# Patient Record
Sex: Female | Born: 1990 | Race: White | Hispanic: No | Marital: Married | Smoking: Never smoker
Health system: Southern US, Community
[De-identification: ages and names within clinical notes are randomized; demographics above are authoritative.]

## PROBLEM LIST (undated history)

## (undated) DIAGNOSIS — Q231 Congenital insufficiency of aortic valve: Secondary | ICD-10-CM

## (undated) DIAGNOSIS — R519 Headache, unspecified: Secondary | ICD-10-CM

## (undated) DIAGNOSIS — R51 Headache: Secondary | ICD-10-CM

## (undated) DIAGNOSIS — Q2381 Bicuspid aortic valve: Secondary | ICD-10-CM

## (undated) HISTORY — DX: Bicuspid aortic valve: Q23.81

## (undated) HISTORY — PX: WISDOM TOOTH EXTRACTION: SHX21

## (undated) HISTORY — DX: Congenital insufficiency of aortic valve: Q23.1

---
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
## 2008-02-10 ENCOUNTER — Ambulatory Visit: Payer: Self-pay | Admitting: Internal Medicine

## 2008-04-18 ENCOUNTER — Encounter: Payer: Self-pay | Admitting: Internal Medicine

## 2008-04-18 ENCOUNTER — Ambulatory Visit: Payer: Self-pay

## 2009-12-11 ENCOUNTER — Encounter (INDEPENDENT_AMBULATORY_CARE_PROVIDER_SITE_OTHER): Payer: Self-pay | Admitting: *Deleted

## 2010-01-31 ENCOUNTER — Ambulatory Visit: Payer: Self-pay | Admitting: Internal Medicine

## 2010-01-31 DIAGNOSIS — I350 Nonrheumatic aortic (valve) stenosis: Secondary | ICD-10-CM

## 2010-12-17 NOTE — Letter (Signed)
Summary: Appointment - Reminder 2  [facility] [employer], Main Office  [address]   [place], [place] [address]   Phone: [phone]  Fax: [phone]     December 11, 2009 MRN: [id number]   Amanda [name] [address] [place], [place]  [address]   Dear [name],  Our records indicate that it is time to schedule a follow-up appointment with Dr. Tenny Craw. It is very important that we reach you to schedule this appointment. We look forward to participating in your health care needs. Please contact us at the number listed above at your earliest convenience to schedule your appointment.  If you are unable to make an appointment at this time, give [place] a call so we can update our records.   Sincerely,    Migdalia Dk [facility] Scheduling Team

## 2010-12-17 NOTE — Assessment & Plan Note (Signed)
Summary: per check out/sf   Visit Type:  Follow-up Primary Provider:  [facility]  CC:  no complaints.  History of Present Illness: Patient is a 20 year old with a history of a bicuspid aortic valve.  She was last in clinic in 2009.  She had an echo done soon after that showed mild AS with a mean gradient of 12 mm Hg.  there was mild AI. Since seen, she has done well.  She denies chest pain.  No shortness of  breath.  No palpitations.  Current Medications (verified): 1)  Yaz 3-0.02 Mg Tabs (Drospirenone-Ethinyl Estradiol) .... As Directed  Allergies (verified): No Known Drug Allergies  Past History:  Family History: Last updated: 01/31/2010  Negative for premature CAD.  Social History: Last updated: 01/31/2010 :  Does not smoke, does not drink.  Student  Past Medical History:  Bicuspid aortic valve   Family History:  Negative for premature CAD.  Social History: :  Does not smoke, does not drink.  Student  Review of Systems       All systems reviewed.  Negatvie to the above problem except as noted above.  Vital Signs:  Patient profile:   20 year old female Height:      61 inches Weight:      100 pounds BMI:     18.96 Pulse rate:   81 / minute BP sitting:   95 / 60  (left arm) Cuff size:   regular  Vitals Entered By: Burnett Kanaris, CNA (January 31, 2010 3:35 PM)  Physical Exam  Additional Exam:  patient is in NAD HEENT:  Normocephalic, atraumatic. EOMI, PERRLA.  Neck: JVP is normal. No thyromegaly. No bruits.  Lungs: clear to auscultation. No rales no wheezes.  Heart: Regular rate and rhythm. Normal S1, S2. No S3.   Gr i-II/VI systolic murmur. PMI not displaced.  Abdomen:  Supple, nontender. Normal bowel sounds. No masses. No hepatomegaly.  Extremities:   Good distal pulses throughout. No lower extremity edema.  Musculoskeletal :moving all extremities.  Neuro:   alert and oriented x3.    Impression & Recommendations:  Problem # 1:   AORTIC STENOSIS/ INSUFFICIENCY, NON-RHEUMATIC (ICD-424.1) Patient is doing well.  Exam again shows mild AS.  No diastolic murmurs noted. I would f/u in 2 years.  Continue activiities with no restriction.  Appended Document: per check out/sf EKG:  NSR.  81 bpm.

## 2011-04-01 NOTE — Assessment & Plan Note (Signed)
[facility]                            CARDIOLOGY OFFICE NOTE   Amanda, [name]                   MRN:          [id number]  DATE:02/10/2008                            DOB:          11/19/1990    IDENTIFICATION:  [name] is an 20 year old who was previously  followed by Dr. Lorna [name] for a bicuspid aortic valve.   The patient last had an echocardiogram back in November 2004 that showed  mild restriction of motion with a mean gradient of 9 mmHg across the  valve.  LV function was normal.  Plan was for recheck in 2 years.   Since seen the patient has done well.  She is a Printmaker at [employer].  She denies chest pain or palpitations.  No  shortness of breath.  No dizziness.   ALLERGIES:  None.   CURRENT MEDICATIONS:  Yaz.   PAST MEDICAL HISTORY:  As above.   SOCIAL HISTORY:  Does not smoke, does not drink.   REVIEW OF SYSTEMS:  Negative to the above except as noted.   FAMILY HISTORY:  Negative.   PHYSICAL EXAM:  The patient is in no distress.  Blood pressure is 92/64 pulse is 68, weight 100.  HEENT:  Normocephalic, atraumatic, EOMI, PERRL.  Mucous membranes moist.  NECK:  JVP is normal.  No thyromegaly.  No bruits.  Lungs are clear to auscultation.  Cardiac exam regular rate and rhythm, S1-S2, positive click.  No  murmurs.  ABDOMEN:  Supple, nontender.  No masses.  No hepatomegaly.  EXTREMITIES:  Good distal pulses, equal onset.  No edema.  SKIN:  Small tattoo in the waistline.   IMPRESSION:  Amanda [name] is an 20 year old with a bicuspid aortic valve on  examination today it does not sound like she has any significant  stenosis.  I do not hear a murmur of regurgitation.  I still think it  would be important to get an echocardiogram since she is now fully  grown.  Last one was done when she was 11.  I will set tentatively to  see her in 2 years otherwise, based on the AHA guidelines, no longer to  take  antibiotics prior to any dental work.   ADDENDUM:  12-lead EKG normal sinus rhythm 65 beats per minute and  nonspecific RV conduction delay.     Amanda Riffle, MD, [facility]  Electronically Signed    PVR/MedQ  DD: 02/10/2008  DT: 02/11/2008  Job #: [id number]   cc:   Dr. Esperanza [name]

## 2012-03-05 ENCOUNTER — Telehealth: Payer: Self-pay | Admitting: Internal Medicine

## 2012-03-05 NOTE — Telephone Encounter (Signed)
closed

## 2012-05-31 ENCOUNTER — Encounter: Payer: Self-pay | Admitting: Internal Medicine

## 2012-05-31 ENCOUNTER — Ambulatory Visit (INDEPENDENT_AMBULATORY_CARE_PROVIDER_SITE_OTHER): Payer: BC Managed Care – PPO | Admitting: Internal Medicine

## 2012-05-31 VITALS — BP 110/70 | HR 79 | Ht 61.0 in | Wt 106.8 lb

## 2012-05-31 DIAGNOSIS — I359 Nonrheumatic aortic valve disorder, unspecified: Secondary | ICD-10-CM

## 2012-05-31 NOTE — Patient Instructions (Signed)
Your physician wants you to follow-up in:  2 years. You will receive a reminder letter in the mail two months in advance. If you don't receive a letter, please call our office to schedule the follow-up appointment.   

## 2012-05-31 NOTE — Progress Notes (Signed)
Amanda [name] is a 21 year old with a history of a bicuspid aortic valve.  She was last in clinic in 2011.  She had an echo done soon after that showed mild AS and  mild AI. Since seen, she has done well.  She denies chest pain.  No shortness of  breath.  No palpitations.  She has questions today about birth control.  Questions IUD vs implant.  Followed by [name].  No Known Allergies  Current Outpatient Prescriptions  Medication Sig Dispense Refill  . CAMILA 0.35 MG tablet Take 1 tablet by mouth daily.       . naproxen sodium (ANAPROX) 550 MG tablet Take 550 mg by mouth as needed.       . SUMAtriptan (IMITREX) 50 MG tablet Take 50 mg by mouth as needed.         Past Medical History  Diagnosis Date  . Bicuspid aortic valve     Past Surgical History  Procedure Date  . Wisdom tooth extraction     Family History  Problem Relation Age of Onset  . Heart disease Maternal Grandmother     History   Social History  . Marital Status: Single    Spouse Name: N/A    Number of Children: N/A  . Years of Education: N/A   Occupational History  . Not on file.   Social History Main Topics  . Smoking status: Never Smoker   . Smokeless tobacco: Not on file  . Alcohol Use: Yes  . Drug Use: No  . Sexually Active:    Other Topics Concern  . Not on file   Social History Narrative   21 year old who was previously followed by Dr Lorna Few for bicuspid aortic valve. 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  They are negative to the above problem except as previously stated.  Vital Signs: BP 110/70  Pulse 79  Ht 5\' 1"  (1.549 m)  Wt 106 lb 12.8 oz (48.444 kg)  BMI 20.18 kg/m2  Physical Exam Patient is in NAD HEENT:  Normocephalic, atraumatic. EOMI, PERRLA.  Neck: JVP is normal. No thyromegaly. No bruits.  Lungs: clear to auscultation. No rales no wheezes.  Heart: Regular rate and rhythm. Normal  S1, S2. No S3.  Gr II/VI systolic murmur at base.  No diastolic murmurs. PMI not displaced.  Abdomen:  Supple, nontender. Normal bowel sounds. No masses. No hepatomegaly.  Extremities:   Good distal pulses throughout. No radial femoral delay. No lower extremity edema.  Musculoskeletal :moving all extremities.  Neuro:   alert and oriented x3.  CN II-XII grossly intact.  EKG:  SR.  79 bpm.   Assessment and Plan:  1.  Bicuspid AV.  Exam suggests that AS remains very mild.  I do not hear a diastolic murmur, suggesting AI remains mild. I will review echo from 2011.  I am not convinced she needs a repeat now.  Continue activites, unrestricted.  2.  Birth control.  I will need to review IUD use (infection rate) with OB and get back in touch with patient Should she get pregnant, I do not think she should have any problmes from a cardiac standpoint.  F/U otherwise in 2 years.

## 2013-04-15 ENCOUNTER — Telehealth: Payer: Self-pay | Admitting: Internal Medicine

## 2013-04-15 NOTE — Telephone Encounter (Signed)
Appt made to see Dr. Tenny Craw.

## 2013-04-15 NOTE — Telephone Encounter (Signed)
New Problem   Pt states she had some chest pain and SOB and EMS was called to assist with her, she said she is feeling fine now and is at home. This occurred while she was at work. She was advised by EMS to contact us. Pt states she is not sure if this was a panic attack or anxiety attack .

## 2013-04-18 ENCOUNTER — Ambulatory Visit (INDEPENDENT_AMBULATORY_CARE_PROVIDER_SITE_OTHER): Payer: BC Managed Care – PPO | Admitting: Internal Medicine

## 2013-04-18 VITALS — BP 108/62 | HR 76 | Ht 61.0 in | Wt 105.2 lb

## 2013-04-18 DIAGNOSIS — R450 Nervousness: Secondary | ICD-10-CM

## 2013-04-18 LAB — CBC WITH DIFFERENTIAL/PLATELET
Basophils Absolute: 0 10*3/uL (ref 0.0–0.1)
Eosinophils Absolute: 0.1 10*3/uL (ref 0.0–0.7)
HCT: 37.1 % (ref 36.0–46.0)
Hemoglobin: 12.5 g/dL (ref 12.0–15.0)
Lymphocytes Relative: 38.8 % (ref 12.0–46.0)
Lymphs Abs: 2.3 10*3/uL (ref 0.7–4.0)
MCHC: 33.8 g/dL (ref 30.0–36.0)
Monocytes Absolute: 0.4 10*3/uL (ref 0.1–1.0)
Neutro Abs: 3.1 10*3/uL (ref 1.4–7.7)
RDW: 13.9 % (ref 11.5–14.6)

## 2013-04-18 LAB — BASIC METABOLIC PANEL
Calcium: 9.4 mg/dL (ref 8.4–10.5)
Creatinine, Ser: 0.6 mg/dL (ref 0.4–1.2)

## 2013-04-18 LAB — TSH: TSH: 0.99 u[IU]/mL (ref 0.35–5.50)

## 2013-04-18 NOTE — Progress Notes (Signed)
HPI Patient is a 22 yo who has a history of bicuspid AV with mild AS/AI  By echo in 2009  I saw her in 2013.  She was previously seen by Larinda Buttery.   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 EMS called  O2 put on  SPell eased in a few min  She felt better  Has not had any problems since. No SOB Active Denies overexertion of upper chest  Getting married in 2 wks. No Known Allergies  Current Outpatient Prescriptions  Medication Sig Dispense Refill  . medroxyPROGESTERone (DEPO-PROVERA) 150 MG/ML injection Inject 150 mg into the muscle every 3 (three) months.      . naproxen sodium (ANAPROX) 550 MG tablet Take 550 mg by mouth as needed.       . SUMAtriptan (IMITREX) 50 MG tablet Take 50 mg by mouth as needed.        No current facility-administered medications for this visit.    Past Medical History  Diagnosis Date  . Bicuspid aortic valve     Past Surgical History  Procedure Laterality Date  . Wisdom tooth extraction      Family History  Problem Relation Age of Onset  . Heart disease Maternal Grandmother     History   Social History  . Marital Status: Single    Spouse Name: N/A    Number of Children: N/A  . Years of Education: N/A   Occupational History  . Not on file.   Social History Main Topics  . Smoking status: Never Smoker   . Smokeless tobacco: Not on file  . Alcohol Use: Yes  . Drug Use: No  . Sexually Active:    Other Topics Concern  . Not on file   Social History Narrative   22 year old who was previously followed by Dr Lorna Few for bicuspid aortic valve. 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  They are negative to the above problem except as previously stated.  Vital Signs: BP 108/62   Pulse 76  Ht 5\' 1"  (1.549 m)  Wt 105 lb 4 oz (47.741 kg)  BMI 19.9 kg/m2  Physical Exam Patient is in NAD HEENT:  Normocephalic, atraumatic. EOMI, PERRLA.  Neck: JVP is normal.  No bruits.  Lungs: clear to auscultation. No rales no wheezes.  Heart: Regular rate and rhythm. Normal S1, S2. No S3.   Gr I/VI systolic murmur LSB No diastolic murmurs. PMI not displaced. Chest  Nontender.  Abdomen:  Supple, nontender. Normal bowel sounds. No masses. No hepatomegaly.  Extremities:   Good distal pulses throughout. No lower extremity edema.  Musculoskeletal :moving all extremities.  Neuro:   alert and oriented x3.  CN II-XII grossly intact.   Assessment and Plan:  1.  CP  Atypical  Sounds muscular  I do not think cardiac WIth jitterienss will check TSH, CBC, BMET  2.  Bicuspid AV  Will get echo this summer  Sounds unchanged but last echo was 200

## 2013-04-18 NOTE — Patient Instructions (Addendum)
LABS TODAY:  CBC, BMET, TSH  Call us after your honeymoon to schedule ECHO. [phone] Kandee Keen

## 2014-06-21 ENCOUNTER — Encounter: Payer: Self-pay | Admitting: Internal Medicine

## 2014-07-07 ENCOUNTER — Ambulatory Visit: Payer: BC Managed Care – PPO | Admitting: Internal Medicine

## 2014-08-06 NOTE — Progress Notes (Signed)
HPI Patient is a 23 yo who has a history of bicuspid AV with mild AS/AI  By echo in 2009   Patinet was las t in clinic in 2014 SInce seen she has done well  No dizziness  No SOB  No CP  Active She does have migraines,  Had since age 67  Aura with HA NOw on inderal with rizatriptan prn  Denies dizziness   No Known Allergies  Current Outpatient Prescriptions  Medication Sig Dispense Refill  . naproxen sodium (ANAPROX) 550 MG tablet Take 550 mg by mouth as needed.       . NON FORMULARY NEXPLANAN  - birth control implant in left arm      . ondansetron (ZOFRAN) 8 MG tablet Take by mouth every 8 (eight) hours as needed for nausea or vomiting.      . propranolol (INDERAL) 80 MG tablet 1 tab by mouth daily      . rizatriptan (MAXALT-MLT) 10 MG disintegrating tablet Take 10 mg by mouth as needed for migraine. May repeat in 2 hours if needed       No current facility-administered medications for this visit.    Past Medical History  Diagnosis Date  . Bicuspid aortic valve     Past Surgical History  Procedure Laterality Date  . Wisdom tooth extraction      Family History  Problem Relation Age of Onset  . Heart disease Maternal Grandmother     History   Social History  . Marital Status: Single    Spouse Name: N/A    Number of Children: N/A  . Years of Education: N/A   Occupational History  . Not on file.   Social History Main Topics  . Smoking status: Never Smoker   . Smokeless tobacco: Not on file  . Alcohol Use: Yes  . Drug Use: No  . Sexual Activity:    Other Topics Concern  . Not on file   Social History Narrative   23 year old who was previously followed by Dr Lorna Few for bicuspid aortic valve. 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  They are negative to the above problem except as previously stated.  Vital Signs: BP 99/66  Pulse 61  Ht  (1.575 m)  Wt 114 lb (51.71 kg)   BMI 20.85 kg/m2  Physical Exam Patient is in NAD HEENT:  Normocephalic, atraumatic. EOMI, PERRLA.  Neck: JVP is normal.  No bruits.  Lungs: clear to auscultation. No rales no wheezes.  Heart: Regular rate and rhythm. Normal S1, S2. No S3.   Gr I/VI systolic murmur LSB No diastolic murmurs. PMI not displaced. Chest  Nontender.  Abdomen:  Supple, nontender. Normal bowel sounds. No masses. No hepatomegaly.  Extremities:   Good distal pulses throughout. No lower extremity edema.  Musculoskeletal :moving all extremities.  Neuro:   alert and oriented x3.  CN II-XII grossly intact.  EKG  SR   60    Assessment and Plan:  1. Bicuspid AV  Exam is unchanged  Will get echo as baseline  Has not had one in 6 years  Not in system  2.  Migraines  OK to use regimen as noted  Stay active  F/u in 18 mon.

## 2014-08-07 ENCOUNTER — Ambulatory Visit (INDEPENDENT_AMBULATORY_CARE_PROVIDER_SITE_OTHER): Payer: BC Managed Care – PPO | Admitting: Internal Medicine

## 2014-08-07 ENCOUNTER — Encounter: Payer: Self-pay | Admitting: Internal Medicine

## 2014-08-07 VITALS — BP 99/66 | HR 61 | Ht 62.0 in | Wt 114.0 lb

## 2014-08-07 DIAGNOSIS — I359 Nonrheumatic aortic valve disorder, unspecified: Secondary | ICD-10-CM

## 2014-08-07 NOTE — Patient Instructions (Signed)
Your physician recommends that you continue on your current medications as directed. Please refer to the Current Medication list given to you today. Your physician has requested that you have an echocardiogram. Echocardiography is a painless test that uses sound waves to create images of your heart. It provides your doctor with information about the size and shape of your heart and how well your heart's chambers and valves are working. This procedure takes approximately one hour. There are no restrictions for this procedure.  Your physician wants you to follow-up in: 18 months with Dr. Tenny Craw.  You will receive a reminder letter in the mail two months in advance. If you don't receive a letter, please call our office to schedule the follow-up appointment.

## 2014-09-11 ENCOUNTER — Ambulatory Visit (HOSPITAL_COMMUNITY): Payer: BC Managed Care – PPO | Attending: Cardiology | Admitting: Radiology

## 2014-09-11 DIAGNOSIS — I359 Nonrheumatic aortic valve disorder, unspecified: Secondary | ICD-10-CM | POA: Diagnosis present

## 2014-09-11 NOTE — Progress Notes (Signed)
Echocardiogram performed.  

## 2016-05-01 ENCOUNTER — Encounter (HOSPITAL_COMMUNITY): Payer: Self-pay | Admitting: Emergency Medicine

## 2016-05-01 DIAGNOSIS — R1030 Lower abdominal pain, unspecified: Secondary | ICD-10-CM | POA: Insufficient documentation

## 2016-05-01 DIAGNOSIS — R102 Pelvic and perineal pain: Secondary | ICD-10-CM | POA: Diagnosis not present

## 2016-05-01 LAB — URINALYSIS, ROUTINE W REFLEX MICROSCOPIC
BILIRUBIN URINE: NEGATIVE
Glucose, UA: NEGATIVE mg/dL
HGB URINE DIPSTICK: NEGATIVE
KETONES UR: NEGATIVE mg/dL
Leukocytes, UA: NEGATIVE
Nitrite: NEGATIVE
Protein, ur: NEGATIVE mg/dL
SPECIFIC GRAVITY, URINE: 1.018 (ref 1.005–1.030)
pH: 7 (ref 5.0–8.0)

## 2016-05-01 LAB — COMPREHENSIVE METABOLIC PANEL
ALBUMIN: 4.1 g/dL (ref 3.5–5.0)
ALT: 65 U/L — AB (ref 14–54)
AST: 44 U/L — AB (ref 15–41)
Alkaline Phosphatase: 53 U/L (ref 38–126)
Anion gap: 7 (ref 5–15)
BUN: 11 mg/dL (ref 6–20)
CHLORIDE: 105 mmol/L (ref 101–111)
CO2: 24 mmol/L (ref 22–32)
CREATININE: 0.64 mg/dL (ref 0.44–1.00)
Calcium: 9.6 mg/dL (ref 8.9–10.3)
GFR calc Af Amer: >60 mL/min (ref >60)
GFR calc non Af Amer: >60 mL/min (ref >60)
GLUCOSE: 95 mg/dL (ref 65–99)
Potassium: 4.1 mmol/L (ref 3.5–5.1)
SODIUM: 136 mmol/L (ref 135–145)
Total Bilirubin: 0.2 mg/dL — ABNORMAL LOW (ref 0.3–1.2)
Total Protein: 7.1 g/dL (ref 6.5–8.1)

## 2016-05-01 LAB — CBC WITH DIFFERENTIAL/PLATELET
BASOS ABS: 0 10*3/uL (ref 0.0–0.1)
Basophils Relative: 0 %
EOS ABS: 0.1 10*3/uL (ref 0.0–0.7)
EOS PCT: 2 %
HCT: 39.8 % (ref 36.0–46.0)
Hemoglobin: 12.9 g/dL (ref 12.0–15.0)
Lymphocytes Relative: 47 %
Lymphs Abs: 2.6 10*3/uL (ref 0.7–4.0)
MCH: 24.8 pg — ABNORMAL LOW (ref 26.0–34.0)
MCHC: 32.4 g/dL (ref 30.0–36.0)
MCV: 76.4 fL — ABNORMAL LOW (ref 78.0–100.0)
Monocytes Absolute: 0.4 10*3/uL (ref 0.1–1.0)
Monocytes Relative: 7 %
NEUTROS PCT: 44 %
Neutro Abs: 2.4 10*3/uL (ref 1.7–7.7)
PLATELETS: 250 10*3/uL (ref 150–400)
RBC: 5.21 MIL/uL — AB (ref 3.87–5.11)
RDW: 12.7 % (ref 11.5–15.5)
WBC: 5.5 10*3/uL (ref 4.0–10.5)

## 2016-05-01 NOTE — ED Notes (Addendum)
Pt c/o lower abd pain,  Onset approx 1 hour ago,  Pt denies any nausea, vomiting or other symptoms.  Pt st's pain started while having intercourse

## 2016-05-02 ENCOUNTER — Emergency Department (HOSPITAL_COMMUNITY): Payer: BC Managed Care – PPO

## 2016-05-02 ENCOUNTER — Emergency Department (HOSPITAL_COMMUNITY)
Admission: EM | Admit: 2016-05-02 | Discharge: 2016-05-02 | Disposition: A | Discharge status: Home / Self Care | Payer: BC Managed Care – PPO | Attending: Emergency Medicine | Admitting: Emergency Medicine

## 2016-05-02 DIAGNOSIS — R109 Unspecified abdominal pain: Secondary | ICD-10-CM

## 2016-05-02 DIAGNOSIS — R102 Pelvic and perineal pain: Secondary | ICD-10-CM

## 2016-05-02 LAB — WET PREP, GENITAL
CLUE CELLS WET PREP: NONE SEEN
SPERM: NONE SEEN
Trich, Wet Prep: NONE SEEN
Yeast Wet Prep HPF POC: NONE SEEN

## 2016-05-02 LAB — GC/CHLAMYDIA PROBE AMP (~~LOC~~) NOT AT ARMC
Chlamydia: NEGATIVE
NEISSERIA GONORRHEA: NEGATIVE

## 2016-05-02 LAB — I-STAT BETA HCG BLOOD, ED (MC, WL, AP ONLY)

## 2016-05-02 NOTE — ED Provider Notes (Signed)
CSN: [id number]     Arrival date & time 05/01/16  1907 History  By signing my name below, I, Amanda PitmanLeila [name], attest that this documentation has been prepared under the direction and in the presence of Geoffery Lyonsouglas Tora Prunty, MD.  Electronically signed: Ginette PitmanLeila [name], ED Scribe. . 1:36 AM.   Chief Complaint  Patient presents with  . Abdominal Pain   The history is provided by the patient. No language interpreter was used.   HPI Comments: Amanda [name] is a 25 y.o. female who presents to the Emergency Department complaining of sudden onset, constant, sharp lower abdominal pain which started at approximately 6:00 pm tonight. Pt states symptoms started while having intercourse. She notes she has recently had her birth control implanon removed in April of this year.  Pt states she is currently on the birth control pills and reports one normal menstrual period since sarting. She denies fever, irregular periods, vaginal bleeding or  vaginal discharge.  Past Medical History  Diagnosis Date  . Bicuspid aortic valve    Past Surgical History  Procedure Laterality Date  . Wisdom tooth extraction     Family History  Problem Relation Age of Onset  . Heart disease Maternal Grandmother    Social History  Substance Use Topics  . Smoking status: Never Smoker   . Smokeless tobacco: None  . Alcohol Use: Yes   OB History    No data available     Review of Systems  Constitutional: Negative for fever.  Gastrointestinal: Positive for abdominal pain.  Genitourinary: Negative for vaginal bleeding, vaginal discharge and menstrual problem.  All other systems reviewed and are negative.  Allergies  Review of patient's allergies indicates no known allergies.  Home Medications   Prior to Admission medications   Medication Sig Start Date End Date Taking? Authorizing Provider  naproxen sodium (ANAPROX) 550 MG tablet Take 550 mg by mouth as needed.  05/26/12   Historical Provider, MD  NON FORMULARY NEXPLANAN  -  birth control implant in left arm    Historical Provider, MD  ondansetron (ZOFRAN) 8 MG tablet Take by mouth every 8 (eight) hours as needed for nausea or vomiting.    Historical Provider, MD  propranolol (INDERAL) 80 MG tablet 1 tab by mouth daily    Historical Provider, MD  rizatriptan (MAXALT-MLT) 10 MG disintegrating tablet Take 10 mg by mouth as needed for migraine. May repeat in 2 hours if needed    Historical Provider, MD   BP 117/68 mmHg  Pulse 70  Temp(Src) 99.4 F (37.4 C) (Oral)  Resp 18  SpO2 100%  LMP 04/14/2016 Physical Exam  Constitutional: She is oriented to person, place, and time. She appears well-developed and well-nourished. No distress.  HENT:  Head: Normocephalic and atraumatic.  Neck: Normal range of motion.  Cardiovascular: Normal rate, regular rhythm and normal heart sounds.   Pulmonary/Chest: Effort normal and breath sounds normal. She has no wheezes.  Abdominal: Soft. Bowel sounds are normal. She exhibits no distension. There is tenderness. There is no rebound and no guarding.  TTP in the suprapubic region.  Genitourinary:  The pelvic examination is essentially unremarkable. There is no evidence for trauma. The cervix appears normal and there are no vaginal abnormalities. There is minimal discharge.  There are no adnexal masses and no cervical motion tenderness.  Musculoskeletal: Normal range of motion.  Neurological: She is alert and oriented to person, place, and time.  Skin: Skin is warm and dry. She is not diaphoretic.  Psychiatric: She has a normal mood and affect. Judgment normal.  Nursing note and vitals reviewed.   ED Course  Procedures  DIAGNOSTIC STUDIES: Oxygen Saturation is 100% on RA, normal by my interpretation.  COORDINATION OF CARE: 12:58 AM Discussed treatment plan with, lab work,  pelvic exam and US pelvis with pt at bedside and pt agreed to plan.  Labs Review Labs Reviewed  CBC WITH DIFFERENTIAL/PLATELET - Abnormal; Notable for  the following:    RBC 5.21 (*)    MCV 76.4 (*)    MCH 24.8 (*)    All other components within normal limits  COMPREHENSIVE METABOLIC PANEL - Abnormal; Notable for the following:    AST 44 (*)    ALT 65 (*)    Total Bilirubin 0.2 (*)    All other components within normal limits  URINALYSIS, ROUTINE W REFLEX MICROSCOPIC (NOT AT [facility])  POC URINE PREG, ED    Imaging Review No results found. I have personally reviewed and evaluated these images and lab results as part of my medical decision-making.    MDM   Final diagnoses:  None    Patient presents with complaints of pelvic pain that started during intercourse. Her laboratory studies are unremarkable, pregnancy test is negative, and physical examination is basically unremarkable. Her ultrasound does show free fluid which could be consistent with a recently ruptured cyst. She will be treated with ibuprofen and when necessary return.  I personally performed the services described in this documentation, which was scribed in my presence. The recorded information has been reviewed and is accurate.      Geoffery Lyons, MD 05/02/16 [phone]

## 2016-05-02 NOTE — ED Notes (Signed)
Pelvic cart at bedside. 

## 2016-05-02 NOTE — ED Notes (Signed)
Pt taken to US

## 2016-05-02 NOTE — Discharge Instructions (Signed)
Ibuprofen 600 mg every 6 hours as needed for pain.  We will call you if your cultures indicate you require further treatment.  Return to the emergency department if symptoms significantly worsen or change.   Abdominal Pain, Adult Many things can cause abdominal pain. Usually, abdominal pain is not caused by a disease and will improve without treatment. It can often be observed and treated at home. Your health care provider will do a physical exam and possibly order blood tests and X-rays to help determine the seriousness of your pain. However, in many cases, more time must pass before a clear cause of the pain can be found. Before that point, your health care provider may not know if you need more testing or further treatment. HOME CARE INSTRUCTIONS Monitor your abdominal pain for any changes. The following actions may help to alleviate any discomfort you are experiencing:  Only take over-the-counter or prescription medicines as directed by your health care provider.  Do not take laxatives unless directed to do so by your health care provider.  Try a clear liquid diet (broth, tea, or water) as directed by your health care provider. Slowly move to a bland diet as tolerated. SEEK MEDICAL CARE IF:  You have unexplained abdominal pain.  You have abdominal pain associated with nausea or diarrhea.  You have pain when you urinate or have a bowel movement.  You experience abdominal pain that wakes you in the night.  You have abdominal pain that is worsened or improved by eating food.  You have abdominal pain that is worsened with eating fatty foods.  You have a fever. SEEK IMMEDIATE MEDICAL CARE IF:  Your pain does not go away within 2 hours.  You keep throwing up (vomiting).  Your pain is felt only in portions of the abdomen, such as the right side or the left lower portion of the abdomen.  You pass bloody or black tarry stools. MAKE SURE YOU:  Understand these  instructions.  Will watch your condition.  Will get help right away if you are not doing well or get worse.   This information is not intended to replace advice given to you by your health care provider. Make sure you discuss any questions you have with your health care provider.   Document Released: 08/13/2005 Document Revised: 07/25/2015 Document Reviewed: 07/13/2013 Elsevier Interactive Patient Education [employer]2016 [employer]

## 2017-05-14 ENCOUNTER — Encounter: Payer: Self-pay | Admitting: Physician Assistant

## 2017-05-19 ENCOUNTER — Ambulatory Visit: Payer: BC Managed Care – PPO | Admitting: Physician Assistant

## 2017-06-03 ENCOUNTER — Encounter: Payer: Self-pay | Admitting: Physician Assistant

## 2017-06-03 ENCOUNTER — Ambulatory Visit (INDEPENDENT_AMBULATORY_CARE_PROVIDER_SITE_OTHER): Payer: BC Managed Care – PPO | Admitting: Physician Assistant

## 2017-06-03 VITALS — BP 106/70 | HR 81 | Ht 61.0 in | Wt 114.4 lb

## 2017-06-03 DIAGNOSIS — Z3A09 9 weeks gestation of pregnancy: Secondary | ICD-10-CM | POA: Diagnosis not present

## 2017-06-03 DIAGNOSIS — I350 Nonrheumatic aortic (valve) stenosis: Secondary | ICD-10-CM | POA: Diagnosis not present

## 2017-06-03 NOTE — Progress Notes (Addendum)
Cardiology Office Note    Date:  06/03/2017   ID:  Amanda [name], DOB [birth year], MRN [id number]  PCP:  Kaleen MaskElkins, Wilson Oliver, MD  Cardiologist:   Dr. Tenny Crawoss  Chief Complaint: 3 year follow up  History of Present Illness:   Amanda [name] is a 26 y.o. female with hx of bicuspid aortic valve & migraines presents for follow up.   Last echo 08/2014 showed normal LVEF, bicuspid aortic valve with mild AS & AR. Last seen by Dr. Tenny Crawoss 07/2014.  Here today for follow up. Her inderal has discontinued as she is pregnant ( 9 weeks). Going through morning sickness. Might feels dizziness when going through morning sickness. Otherwise no exertional chest pain, dyspnea or dizziness. Denies Radiation, orthopnea, PND, syncope, lower extremity edema or blood in her stool or urine.   Past Medical History:  Diagnosis Date  . Bicuspid aortic valve     Past Surgical History:  Procedure Laterality Date  . WISDOM TOOTH EXTRACTION      Current Medications: Prior to Admission medications   Medication Sig Start Date End Date Taking? Authorizing Provider  Prenatal Vit-Fe Fumarate-FA (PRENATAL VITAMIN PO) Take 1 tablet by mouth daily.   Yes [provider]     Allergies:   Zofran Frazier Richards[ondansetron hcl]   Social History   Social History  . Marital status: Single    Spouse name: N/A  . Number of children: N/A  . Years of education: N/A   Social History Main Topics  . Smoking status: Never Smoker  . Smokeless tobacco: Never Used  . Alcohol use Yes  . Drug use: No  . Sexual activity: Not Asked   Other Topics Concern  . None   Social History Narrative   26 year old who was previously followed by Dr Lorna FewStewart Schall for bicuspid aortic valve. 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 She does not smoke or drink     Family History:  The patient's family history includes Heart disease in her maternal grandmother.   ROS:   Please see the history of present illness.    ROS All  other systems reviewed and are negative.   PHYSICAL EXAM:   VS:  BP 106/70   Pulse 81   Ht 5\' 1"  (1.549 m)   Wt 114 lb 6.4 oz (51.9 kg)   BMI 21.62 kg/m    GEN: Well nourished, well developed, in no acute distress  HEENT: normal  Neck: no JVD, carotid bruits, or masses Cardiac: RRR; no murmurs, rubs, or gallops,no edema  Respiratory:  clear to auscultation bilaterally, normal work of breathing GI: soft, nontender, nondistended, + BS MS: no deformity or atrophy  Skin: warm and dry, no rash Neuro:  Alert and Oriented x 3, Strength and sensation are intact Psych: euthymic mood, full affect  Wt Readings from Last 3 Encounters:  06/03/17 114 lb 6.4 oz (51.9 kg)  08/07/14 114 lb (51.7 kg)  04/18/13 105 lb 4 oz (47.7 kg)      Studies/Labs Reviewed:   EKG:  EKG is ordered today.  EKG shows sinus rhythm at rate of 81 bpm and PACs.  Recent Labs: No results found for requested labs within last 8760 hours.   Lipid Panel No results found for: CHOL, TRIG, HDL, CHOLHDL, VLDL, LDLCALC, LDLDIRECT  Additional studies/ records that were reviewed today include:   Echocardiogram: 08/2014 Study Conclusions  - Left ventricle: The cavity size was normal. Wall thickness was normal. Systolic function was  normal. The estimated ejection fraction was in the range of 60% to 65%. - Aortic valve: Bicuspid. There was very mild stenosis. There was mild regurgitation. - Atrial septum: No defect or patent foramen ovale was identified.    ASSESSMENT & PLAN:    1. Bicuspid aortic valve - Last echocardiogram in 2015 showed mild AS & AR. No significant murmur appreciated on exam. No syncope, dyspnea, chest pain or dizziness other than morning sickness.  2. Pregnancy - Advised to drink plenty of water.     Medication Adjustments/Labs and Tests Ordered: Current medicines are reviewed at length with the patient today.  Concerns regarding medicines are outlined above.  Medication changes,  Labs and Tests ordered today are listed in the Patient Instructions below. Patient Instructions  Medication Instructions:   Your physician recommends that you continue on your current medications as directed. Please refer to the Current Medication list given to you today.   If you need a refill on your cardiac medications before your next appointment, please call your pharmacy.  Labwork: NONE ORDERED  TODAY    Testing/Procedures: NONE ORDERED  TODAY    Follow-Up: Your physician wants you to follow-up in: ONE YEAR WITH ROSS  You will receive a reminder letter in the mail two months in advance. If you don't receive a letter, please call our office to schedule the follow-up appointment.     Any Other Special Instructions Will Be Listed Below (If Applicable).                                                                                                                                                      Lorelei Pont, [place]  06/03/2017 2:53 PM    [facility] [address] [place], [place], [place]  [address] Phone: [phone]; Fax: [phone]

## 2017-06-03 NOTE — Patient Instructions (Signed)
Medication Instructions:   Your physician recommends that you continue on your current medications as directed. Please refer to the Current Medication list given to you today.   If you need a refill on your cardiac medications before your next appointment, please call your pharmacy.  Labwork:  NONE ORDERED  TODAY    Testing/Procedures: NONE ORDERED  TODAY    Follow-Up:  Your physician wants you to follow-up in: ONE YEAR WITH ROSS  You will receive a reminder letter in the mail two months in advance. If you don't receive a letter, please call our office to schedule the follow-up appointment.       Any Other Special Instructions Will Be Listed Below (If Applicable).                                                                                                                                                   

## 2017-06-05 ENCOUNTER — Telehealth: Payer: Self-pay | Admitting: *Deleted

## 2017-06-05 DIAGNOSIS — I350 Nonrheumatic aortic (valve) stenosis: Secondary | ICD-10-CM

## 2017-06-05 NOTE — Progress Notes (Signed)
I would repeat echo to confirm gradients

## 2017-06-05 NOTE — Progress Notes (Signed)
Please get complete echo to evaluate gradient for bicuspid valve as recommended by Dr.  Tenny Crawoss.

## 2017-06-05 NOTE — Telephone Encounter (Signed)
Left a message for pt to call back re Dr. Tenny Crawoss wants to order another echo.

## 2017-06-05 NOTE — Telephone Encounter (Signed)
-----   Message from EvaBhavinkumar Bhagat, [place] sent at 06/05/2017  3:34 PM EDT -----   ----- Message ----- From: Pricilla Riffleoss, Paula V, MD Sent: 06/05/2017   2:32 PM To: Manson PasseyBhavinkumar Bhagat, PA    ----- Message ----- From: Manson PasseyBhagat, Bhavinkumar, PA Sent: 06/03/2017   2:59 PM To: Pricilla RifflePaula Ross V, MD  Please advised regarding surveillance echocardiogram. She is 9 pregnant and is essentially asymptomatic except morning. No significant murmur.

## 2017-06-08 NOTE — Telephone Encounter (Signed)
Progress Notes Encounter Date: 06/03/2017 Pricilla Riffleoss, Paula V, MD  Cardiology     I would repeat echo to confirm gradients      Per Dr. Tenny Crawoss, ECHO ordered for scheduling. Patient agrees with treatment plan.

## 2017-06-08 NOTE — Telephone Encounter (Signed)
F/u message ° °Pt returning RN call. Please call back to discuss  °

## 2017-06-10 NOTE — Telephone Encounter (Signed)
Echo scheduled 06/12/17.

## 2017-06-11 LAB — OB RESULTS CONSOLE RUBELLA ANTIBODY, IGM: RUBELLA: IMMUNE

## 2017-06-11 LAB — OB RESULTS CONSOLE GC/CHLAMYDIA
CHLAMYDIA, DNA PROBE: NEGATIVE
GC PROBE AMP, GENITAL: NEGATIVE

## 2017-06-11 LAB — OB RESULTS CONSOLE HIV ANTIBODY (ROUTINE TESTING): HIV: NONREACTIVE

## 2017-06-11 LAB — OB RESULTS CONSOLE HEPATITIS B SURFACE ANTIGEN: Hepatitis B Surface Ag: NEGATIVE

## 2017-06-12 ENCOUNTER — Ambulatory Visit (HOSPITAL_COMMUNITY): Payer: BC Managed Care – PPO | Attending: Cardiology

## 2017-06-12 ENCOUNTER — Other Ambulatory Visit: Payer: Self-pay

## 2017-06-12 DIAGNOSIS — Q231 Congenital insufficiency of aortic valve: Secondary | ICD-10-CM | POA: Diagnosis not present

## 2017-06-12 DIAGNOSIS — O99411 Diseases of the circulatory system complicating pregnancy, first trimester: Secondary | ICD-10-CM | POA: Insufficient documentation

## 2017-06-12 DIAGNOSIS — I350 Nonrheumatic aortic (valve) stenosis: Secondary | ICD-10-CM

## 2017-06-12 DIAGNOSIS — Z3A10 10 weeks gestation of pregnancy: Secondary | ICD-10-CM | POA: Diagnosis not present

## 2017-07-28 ENCOUNTER — Telehealth: Payer: Self-pay | Admitting: Internal Medicine

## 2017-07-28 NOTE — Telephone Encounter (Signed)
New message       Calling to let Dr Tenny Crawoss know she is [redacted] weeks pregnant.  Will Dr Tenny Crawoss want to see her?  Is she considered "high risk" ?  Ok to call tomorrow

## 2017-07-28 NOTE — Telephone Encounter (Signed)
No she is not considered high risk with mean graident across AV of 11 mm Hg I can see her closer to 30 wks

## 2017-07-28 NOTE — Telephone Encounter (Signed)
Notes recorded by Pricilla Riffleoss, Paula V, MD on 06/14/2017 at 11:56 PM EDT Pumping function of heart is normal AV again noted to be bicuspic with very mild narrowing  No significant change from echo form 2015 Should not pose a problem with pregnancy    Echo was 06/14/17.  Will route to Dr. Tenny Crawoss to inform and then will call patient back if any recommendations.

## 2017-07-30 NOTE — Telephone Encounter (Signed)
Called patient about Dr. Tenny Crawoss' message. Patient will be at 30 weeks on the second week of December. Made an appointment with Dr. Tenny Crawoss on 10/26/17. Informed patient per Dr. Tenny Crawoss, that she is not considered hight risk with mean gradient across AV of 11 mg Hg. Patient verbalized understanding.

## 2017-07-30 NOTE — Telephone Encounter (Signed)
Follow up  ° ° ° °Pt is calling to follow up on this. Please call.  °

## 2017-08-11 ENCOUNTER — Ambulatory Visit (HOSPITAL_COMMUNITY): Payer: BC Managed Care – PPO

## 2017-08-11 ENCOUNTER — Encounter (HOSPITAL_COMMUNITY): Payer: BC Managed Care – PPO

## 2017-08-11 ENCOUNTER — Encounter (HOSPITAL_COMMUNITY): Payer: Self-pay

## 2017-08-14 ENCOUNTER — Encounter (HOSPITAL_COMMUNITY): Payer: Self-pay

## 2017-08-14 ENCOUNTER — Encounter (HOSPITAL_COMMUNITY): Payer: BC Managed Care – PPO

## 2017-08-14 ENCOUNTER — Other Ambulatory Visit (HOSPITAL_COMMUNITY): Payer: BC Managed Care – PPO

## 2017-10-21 LAB — OB RESULTS CONSOLE RPR: RPR: NONREACTIVE

## 2017-10-26 ENCOUNTER — Ambulatory Visit: Payer: BC Managed Care – PPO | Admitting: Internal Medicine

## 2017-10-28 ENCOUNTER — Telehealth: Payer: Self-pay | Admitting: Internal Medicine

## 2017-10-28 NOTE — Telephone Encounter (Signed)
F/U call:  Patient calling back, states that she is [redacted] weeks pregnant and that she was told to f/u with Dr. Tenny Crawoss for a 30 week check-up.

## 2017-10-28 NOTE — Telephone Encounter (Signed)
[redacted] weeks gestation--Having some SOB and palpitations.  Seeing obstetrics every other week. Had fetal echo to be on safe side. Was instructed to f/u at around 30 weeks.

## 2017-10-29 ENCOUNTER — Encounter: Payer: Self-pay | Admitting: Internal Medicine

## 2017-10-29 ENCOUNTER — Ambulatory Visit: Payer: BC Managed Care – PPO | Admitting: Internal Medicine

## 2017-10-29 VITALS — BP 102/58 | HR 80 | Ht 61.0 in | Wt 126.0 lb

## 2017-10-29 DIAGNOSIS — R002 Palpitations: Secondary | ICD-10-CM

## 2017-10-29 DIAGNOSIS — Q2381 Bicuspid aortic valve: Secondary | ICD-10-CM

## 2017-10-29 DIAGNOSIS — Q231 Congenital insufficiency of aortic valve: Secondary | ICD-10-CM

## 2017-10-29 NOTE — Patient Instructions (Addendum)
Your physician recommends that you continue on your current medications as directed. Please refer to the Current Medication list given to you today. Your physician wants you to follow-up in: 9 months with Dr. Ross.  You will receive a reminder letter in the mail two months in advance. If you don't receive a letter, please call our office to schedule the follow-up appointment.  

## 2017-10-29 NOTE — Progress Notes (Signed)
   Cardiology Office Note   Date:  10/29/2017   ID:  Amanda [name], DOB [birth year], MRN [id number]  PCP:  Kaleen MaskElkins, Wilson Oliver, MD  Cardiologist:   Dietrich PatesPaula Alise Calais, MD   F/U aortic stenosis     History of Present Illness: Amanda [name] is a 26 y.o. female with a history of bicuspid aortic valve and micranes   I saw her this in 2015    She was seen by B Bhagat earlier this fall The pt had an echo done in July that showed mean gradient of 11 mm Hg   Mild to mod AI   The pt is now [redacted] wks pregnant   Notes some SOB with actiivty  Some heart racing         Current Meds  Medication Sig  . IRON PO Take by mouth daily.   . Prenatal Vit-Fe Fumarate-FA (PRENATAL VITAMIN PO) Take 1 tablet by mouth daily.     Allergies:   Rizatriptan and Zofran [ondansetron hcl]   Past Medical History:  Diagnosis Date  . Bicuspid aortic valve     Past Surgical History:  Procedure Laterality Date  . WISDOM TOOTH EXTRACTION       Social History:  The patient  reports that  has never smoked. she has never used smokeless tobacco. She reports that she drinks alcohol. She reports that she does not use drugs.   Family History:  The patient's family history includes Heart disease in her maternal grandmother.    ROS:  Please see the history of present illness. All other systems are reviewed and  Negative to the above problem except as noted.    PHYSICAL EXAM: VS:  BP (!) 102/58   Pulse 80   Ht 5\' 1"  (1.549 m)   Wt 126 lb (57.2 kg)   BMI 23.81 kg/m   GEN: Well nourished, well developed, in no acute distress  HEENT: normal  Neck: no JVD, carotid bruits, or masses Cardiac: RRR;   Gr I/VI sytolic murmur base  no, rubs, or gallops,no edema  Respiratory:  clear to auscultation bilaterally, normal work of breathing GI: soft, nontender, distended, + BS  No hepatomegaly  MS: no deformity Moving all extremities   Skin: warm and dry, no rash Neuro:  Strength and sensation are intact Psych:  euthymic mood, full affect   EKG:  EKG is not ordered today.   Lipid Panel No results found for: CHOL, TRIG, HDL, CHOLHDL, VLDL, LDLCALC, LDLDIRECT    Wt Readings from Last 3 Encounters:  10/29/17 126 lb (57.2 kg)  06/03/17 114 lb 6.4 oz (51.9 kg)  08/07/14 114 lb (51.7 kg)      ASSESSMENT AND PLAN:  1  Aortic valve  Bicuspic aortic valve  Minimally restricted  Mild/mod AI    Follow   I do not think this will cause a problem with prob with deliveriy   I do not think heart racing an arrhythmia  Probable sinus tach due to increased demand   Follow   Stay hydrated     Plan f/u in clinic next fall    Current medicines are reviewed at length with the patient today.  The patient does not have concerns regarding medicines.  Signed, Dietrich PatesPaula Taetum Flewellen, MD  10/29/2017 9:22 AM    [facility] [address], [place], [place]  [address] Phone: [phone](336) [phone]; Fax: [phone]

## 2017-10-29 NOTE — Telephone Encounter (Signed)
Patient was seen by Dr. Tenny Crawoss this AM in clinic.

## 2017-11-17 NOTE — L&D Delivery Note (Signed)
Cesarean Section Procedure Note   Amanda [name]  12/28/2017  Indications: Breech Presentation and active labor   Pre-operative Diagnosis: primary c/s malpresentation.   Post-operative Diagnosis: Same   Surgeon: Surgeon(s) and Role:    * Oria Klimas, Candace GallusSusan E, MD - Primary   Assistants: Carlean JewsMeredith Sigmon, CNM  Anesthesia: epidural   Procedure Details:  The patient was evaluated in labor room and fetus noted to be in breech presentation. The risks, benefits, complications, treatment options, and expected outcomes were discussed with the patient. The patient concurred with the proposed plan, giving informed consent. identified as Amanda [name] and the procedure verified as C-Section Delivery. A Time Out was held and the above information confirmed.  After induction of anesthesia, the patient was draped and prepped in the usual sterile manner. A transverse was made and carried down through the subcutaneous tissue to the fascia. Fascial incision was made and extended transversely. The fascia was separated from the underlying rectus tissue superiorly and inferiorly. The peritoneum was identified and entered. Peritoneal incision was extended longitudinally. The utero-vesical peritoneal reflection was incised transversely and the bladder flap was bluntly freed from the lower uterine segment. A low transverse uterine incision was made. Delivered from breech (frank) presentation was a viable female infant with Apgar scores of 9 at one minute and 9 at five minutes. Cord ph was not sent the umbilical cord was clamped and cut cord blood was obtained for evaluation. The placenta was removed Intact and appeared normal. The uterine outline, tubes and ovaries appeared normal}. The uterine incision was exteriorized and cleared of all clot and debris.  The uterine incision was then closed with running locked sutures of 0 monocryl.  An embrocating layer was then performed with 0 monocryl. Hemostasis was observed.  Lavage was carried out until clear. The uterus was then placed back into the abdomen and uterine incision still hemostatic.  The gutters were cleared with moist laps.  Excellent hemostasis was ntoed.  The peritoneum was then reapproximated with 3-0 vicryl in a running fashion.  The rectus muscles were then reaproximated with 3-0 vicryl. The fascia was then reapproximated with running sutures of 0Vicryl. The subcuticular closure was performed using 2Vicryl. The skin was closed with 4-0Vicryl.   Instrument, sponge, and needle counts were correct prior the abdominal closure and were correct at the conclusion of the case.    Findings: viable female infant, apgars 9/9; normal tubes/ovaries, uterus   Estimated Blood Loss: 561 mL   Total IV Fluids: 2000ml   Urine Output: 100 ml clear urine  Specimens: @ORSPECIMEN @   Complications: no complications  Disposition: PACU - hemodynamically stable.   Maternal Condition: stable   Baby condition / location:  Couplet care / Skin to Skin  Attending Attestation: I was present and scrubbed for the entire procedure.   Signed: Surgeon(s): Amado NashAlmquist, Candace GallusSusan E, MD

## 2017-12-08 LAB — OB RESULTS CONSOLE GBS: GBS: NEGATIVE

## 2017-12-28 ENCOUNTER — Other Ambulatory Visit: Payer: Self-pay

## 2017-12-28 ENCOUNTER — Inpatient Hospital Stay (HOSPITAL_COMMUNITY)
Admission: AD | Admit: 2017-12-28 | Discharge: 2017-12-30 | DRG: 786 | Disposition: A | Discharge status: Home / Self Care | Payer: BC Managed Care – PPO | Source: Ambulatory Visit | Attending: Obstetrics and Gynecology | Admitting: Obstetrics and Gynecology

## 2017-12-28 ENCOUNTER — Inpatient Hospital Stay (HOSPITAL_COMMUNITY): Payer: BC Managed Care – PPO | Admitting: Anesthesiology

## 2017-12-28 ENCOUNTER — Encounter (HOSPITAL_COMMUNITY): Payer: Self-pay

## 2017-12-28 ENCOUNTER — Encounter (HOSPITAL_COMMUNITY)
Admission: AD | Disposition: A | Discharge status: Home / Self Care | Payer: Self-pay | Source: Ambulatory Visit | Attending: Obstetrics and Gynecology

## 2017-12-28 DIAGNOSIS — Z6791 Unspecified blood type, Rh negative: Secondary | ICD-10-CM

## 2017-12-28 DIAGNOSIS — Z3A38 38 weeks gestation of pregnancy: Secondary | ICD-10-CM | POA: Diagnosis not present

## 2017-12-28 DIAGNOSIS — Q231 Congenital insufficiency of aortic valve: Secondary | ICD-10-CM | POA: Diagnosis not present

## 2017-12-28 DIAGNOSIS — O9902 Anemia complicating childbirth: Secondary | ICD-10-CM | POA: Diagnosis present

## 2017-12-28 DIAGNOSIS — O9942 Diseases of the circulatory system complicating childbirth: Secondary | ICD-10-CM | POA: Diagnosis present

## 2017-12-28 DIAGNOSIS — O321XX0 Maternal care for breech presentation, not applicable or unspecified: Principal | ICD-10-CM

## 2017-12-28 DIAGNOSIS — D62 Acute posthemorrhagic anemia: Secondary | ICD-10-CM | POA: Diagnosis present

## 2017-12-28 DIAGNOSIS — O26893 Other specified pregnancy related conditions, third trimester: Secondary | ICD-10-CM | POA: Diagnosis present

## 2017-12-28 HISTORY — DX: Headache: R51

## 2017-12-28 HISTORY — DX: Headache, unspecified: R51.9

## 2017-12-28 LAB — CBC
HEMATOCRIT: 34.3 % — AB (ref 36.0–46.0)
Hemoglobin: 10.7 g/dL — ABNORMAL LOW (ref 12.0–15.0)
MCH: 22.6 pg — ABNORMAL LOW (ref 26.0–34.0)
MCHC: 31.2 g/dL (ref 30.0–36.0)
MCV: 72.5 fL — ABNORMAL LOW (ref 78.0–100.0)
Platelets: 209 10*3/uL (ref 150–400)
RBC: 4.73 MIL/uL (ref 3.87–5.11)
RDW: 20.5 % — AB (ref 11.5–15.5)
WBC: 13.2 10*3/uL — AB (ref 4.0–10.5)

## 2017-12-28 LAB — OB RESULTS CONSOLE ABO/RH: RH Type: NEGATIVE

## 2017-12-28 LAB — TYPE AND SCREEN
ABO/RH(D): B NEG
ANTIBODY SCREEN: NEGATIVE

## 2017-12-28 LAB — RPR: RPR Ser Ql: NONREACTIVE

## 2017-12-28 LAB — ABO/RH: ABO/RH(D): B NEG

## 2017-12-28 SURGERY — Surgical Case
Anesthesia: Epidural

## 2017-12-28 MED ORDER — OXYTOCIN BOLUS FROM INFUSION: 500.0000 mL | Freq: Once | INTRAVENOUS | Status: DC

## 2017-12-28 MED ORDER — OXYCODONE-ACETAMINOPHEN 5-325 MG PO TABS
1.0000 | ORAL_TABLET | ORAL | Status: DC | PRN
  Administered 2017-12-29 – 2017-12-30 (×4): 1 via ORAL
  Filled 2017-12-28 (×6): qty 1

## 2017-12-28 MED ORDER — PHENYLEPHRINE 40 MCG/ML (10ML) SYRINGE FOR IV PUSH (FOR BLOOD PRESSURE SUPPORT)
80.0000 ug | PREFILLED_SYRINGE | INTRAVENOUS | Status: DC | PRN
  Filled 2017-12-28: qty 10

## 2017-12-28 MED ORDER — COCONUT OIL OIL
1.0000 "application " | TOPICAL_OIL | Status: DC | PRN
  Administered 2017-12-29: 1 via TOPICAL
  Filled 2017-12-28: qty 120

## 2017-12-28 MED ORDER — LACTATED RINGERS IV SOLN
INTRAVENOUS | Status: DC | PRN
  Administered 2017-12-28 (×2): via INTRAVENOUS

## 2017-12-28 MED ORDER — FENTANYL CITRATE (PF) 100 MCG/2ML IJ SOLN: 25.0000 ug | INTRAMUSCULAR | Status: DC | PRN

## 2017-12-28 MED ORDER — LACTATED RINGERS IV SOLN: INTRAVENOUS | Status: DC

## 2017-12-28 MED ORDER — NALBUPHINE HCL 10 MG/ML IJ SOLN: 5.0000 mg | Freq: Once | INTRAMUSCULAR | Status: AC | PRN

## 2017-12-28 MED ORDER — MENTHOL 3 MG MT LOZG: 1.0000 | LOZENGE | OROMUCOSAL | Status: DC | PRN

## 2017-12-28 MED ORDER — TETANUS-DIPHTH-ACELL PERTUSSIS 5-2.5-18.5 LF-MCG/0.5 IM SUSP: 0.5000 mL | Freq: Once | INTRAMUSCULAR | Status: DC

## 2017-12-28 MED ORDER — DEXAMETHASONE SODIUM PHOSPHATE 10 MG/ML IJ SOLN
INTRAMUSCULAR | Status: AC
Start: 2017-12-28 — End: 2017-12-28
  Filled 2017-12-28: qty 1

## 2017-12-28 MED ORDER — PROMETHAZINE HCL 25 MG/ML IJ SOLN
12.5000 mg | Freq: Four times a day (QID) | INTRAMUSCULAR | Status: DC | PRN
  Administered 2017-12-28: 12.5 mg via INTRAVENOUS
  Filled 2017-12-28: qty 1

## 2017-12-28 MED ORDER — NALBUPHINE HCL 10 MG/ML IJ SOLN: 5.0000 mg | INTRAMUSCULAR | Status: DC | PRN

## 2017-12-28 MED ORDER — METOCLOPRAMIDE HCL 5 MG/ML IJ SOLN
INTRAMUSCULAR | Status: AC
  Filled 2017-12-28: qty 2

## 2017-12-28 MED ORDER — FLEET ENEMA 7-19 GM/118ML RE ENEM: 1.0000 | ENEMA | RECTAL | Status: DC | PRN

## 2017-12-28 MED ORDER — DIBUCAINE 1 % RE OINT: 1.0000 "application " | TOPICAL_OINTMENT | RECTAL | Status: DC | PRN

## 2017-12-28 MED ORDER — SIMETHICONE 80 MG PO CHEW: 80.0000 mg | CHEWABLE_TABLET | ORAL | Status: DC | PRN

## 2017-12-28 MED ORDER — SODIUM CHLORIDE 0.9% FLUSH: 3.0000 mL | INTRAVENOUS | Status: DC | PRN

## 2017-12-28 MED ORDER — SODIUM BICARBONATE 8.4 % IV SOLN
INTRAVENOUS | Status: AC
  Filled 2017-12-28: qty 50

## 2017-12-28 MED ORDER — OXYCODONE-ACETAMINOPHEN 5-325 MG PO TABS
2.0000 | ORAL_TABLET | ORAL | Status: DC | PRN
  Administered 2017-12-29: 2 via ORAL

## 2017-12-28 MED ORDER — WITCH HAZEL-GLYCERIN EX PADS: 1.0000 "application " | MEDICATED_PAD | CUTANEOUS | Status: DC | PRN

## 2017-12-28 MED ORDER — LACTATED RINGERS IV SOLN
INTRAVENOUS | Status: DC
  Administered 2017-12-28 – 2017-12-29 (×2): via INTRAVENOUS

## 2017-12-28 MED ORDER — SCOPOLAMINE 1 MG/3DAYS TD PT72
MEDICATED_PATCH | TRANSDERMAL | Status: DC | PRN
  Administered 2017-12-28: 1 via TRANSDERMAL

## 2017-12-28 MED ORDER — LACTATED RINGERS IV SOLN
INTRAVENOUS | Status: DC
  Administered 2017-12-28: 01:00:00 via INTRAVENOUS

## 2017-12-28 MED ORDER — ACETAMINOPHEN 325 MG PO TABS
650.0000 mg | ORAL_TABLET | ORAL | Status: DC | PRN
  Administered 2017-12-29: 650 mg via ORAL
  Filled 2017-12-28: qty 2

## 2017-12-28 MED ORDER — METOCLOPRAMIDE HCL 5 MG/ML IJ SOLN: 10.0000 mg | Freq: Once | INTRAMUSCULAR | Status: DC | PRN

## 2017-12-28 MED ORDER — SIMETHICONE 80 MG PO CHEW
80.0000 mg | CHEWABLE_TABLET | Freq: Three times a day (TID) | ORAL | Status: DC
  Administered 2017-12-28 – 2017-12-30 (×5): 80 mg via ORAL
  Filled 2017-12-28 (×5): qty 1

## 2017-12-28 MED ORDER — KETOROLAC TROMETHAMINE 30 MG/ML IJ SOLN
30.0000 mg | Freq: Four times a day (QID) | INTRAMUSCULAR | Status: DC
  Administered 2017-12-28: 30 mg via INTRAVENOUS
  Filled 2017-12-28: qty 1

## 2017-12-28 MED ORDER — SODIUM BICARBONATE 8.4 % IV SOLN
INTRAVENOUS | Status: DC | PRN
  Administered 2017-12-28 (×4): 5 mL via EPIDURAL

## 2017-12-28 MED ORDER — LACTATED RINGERS IV SOLN
INTRAVENOUS | Status: DC | PRN
  Administered 2017-12-28: 11:00:00 via INTRAVENOUS

## 2017-12-28 MED ORDER — DEXAMETHASONE SODIUM PHOSPHATE 4 MG/ML IJ SOLN
INTRAMUSCULAR | Status: DC | PRN
  Administered 2017-12-28: 5 mg via INTRAVENOUS

## 2017-12-28 MED ORDER — LACTATED RINGERS IV SOLN
500.0000 mL | Freq: Once | INTRAVENOUS | Status: AC
  Administered 2017-12-28: 500 mL via INTRAVENOUS

## 2017-12-28 MED ORDER — PHENYLEPHRINE 40 MCG/ML (10ML) SYRINGE FOR IV PUSH (FOR BLOOD PRESSURE SUPPORT): 80.0000 ug | PREFILLED_SYRINGE | INTRAVENOUS | Status: DC | PRN

## 2017-12-28 MED ORDER — IBUPROFEN 600 MG PO TABS
600.0000 mg | ORAL_TABLET | Freq: Four times a day (QID) | ORAL | Status: DC
  Administered 2017-12-29 – 2017-12-30 (×8): 600 mg via ORAL
  Filled 2017-12-28 (×8): qty 1

## 2017-12-28 MED ORDER — PRENATAL MULTIVITAMIN CH
1.0000 | ORAL_TABLET | Freq: Every day | ORAL | Status: DC
  Administered 2017-12-29 – 2017-12-30 (×2): 1 via ORAL
  Filled 2017-12-28 (×2): qty 1

## 2017-12-28 MED ORDER — OXYTOCIN 40 UNITS IN LACTATED RINGERS INFUSION - SIMPLE MED
2.5000 [IU]/h | INTRAVENOUS | Status: DC
  Filled 2017-12-28: qty 1000

## 2017-12-28 MED ORDER — LIDOCAINE-EPINEPHRINE (PF) 2 %-1:200000 IJ SOLN
INTRAMUSCULAR | Status: AC
  Filled 2017-12-28: qty 20

## 2017-12-28 MED ORDER — DIPHENHYDRAMINE HCL 50 MG/ML IJ SOLN: 12.5000 mg | INTRAMUSCULAR | Status: DC | PRN

## 2017-12-28 MED ORDER — METOCLOPRAMIDE HCL 5 MG/ML IJ SOLN
INTRAMUSCULAR | Status: DC | PRN
  Administered 2017-12-28 (×2): 5 mg via INTRAVENOUS

## 2017-12-28 MED ORDER — NALOXONE HCL 4 MG/10ML IJ SOLN: 1.0000 ug/kg/h | INTRAVENOUS | Status: DC | PRN

## 2017-12-28 MED ORDER — KETOROLAC TROMETHAMINE 30 MG/ML IJ SOLN
30.0000 mg | Freq: Four times a day (QID) | INTRAMUSCULAR | Status: DC | PRN
  Administered 2017-12-28: 30 mg via INTRAMUSCULAR

## 2017-12-28 MED ORDER — NALBUPHINE HCL 10 MG/ML IJ SOLN
5.0000 mg | Freq: Once | INTRAMUSCULAR | Status: AC | PRN
  Administered 2017-12-28: 5 mg via INTRAVENOUS
  Filled 2017-12-28: qty 1

## 2017-12-28 MED ORDER — OXYTOCIN 40 UNITS IN LACTATED RINGERS INFUSION - SIMPLE MED: 2.5000 [IU]/h | INTRAVENOUS | Status: DC

## 2017-12-28 MED ORDER — ACETAMINOPHEN 500 MG PO TABS
1000.0000 mg | ORAL_TABLET | Freq: Four times a day (QID) | ORAL | Status: DC
  Administered 2017-12-28 – 2017-12-29 (×3): 1000 mg via ORAL
  Filled 2017-12-28 (×3): qty 2

## 2017-12-28 MED ORDER — KETOROLAC TROMETHAMINE 30 MG/ML IJ SOLN: 30.0000 mg | Freq: Four times a day (QID) | INTRAMUSCULAR | Status: DC | PRN

## 2017-12-28 MED ORDER — FENTANYL 2.5 MCG/ML BUPIVACAINE 1/10 % EPIDURAL INFUSION (WH - ANES)
14.0000 mL/h | INTRAMUSCULAR | Status: DC | PRN
  Administered 2017-12-28: 14 mL/h via EPIDURAL
  Filled 2017-12-28: qty 100

## 2017-12-28 MED ORDER — LIDOCAINE HCL (PF) 1 % IJ SOLN
INTRAMUSCULAR | Status: DC | PRN
  Administered 2017-12-28 (×2): 5 mL via EPIDURAL

## 2017-12-28 MED ORDER — SIMETHICONE 80 MG PO CHEW
80.0000 mg | CHEWABLE_TABLET | ORAL | Status: DC
  Administered 2017-12-29 (×2): 80 mg via ORAL
  Filled 2017-12-28 (×2): qty 1

## 2017-12-28 MED ORDER — OXYCODONE-ACETAMINOPHEN 5-325 MG PO TABS: 1.0000 | ORAL_TABLET | ORAL | Status: DC | PRN

## 2017-12-28 MED ORDER — OXYCODONE-ACETAMINOPHEN 5-325 MG PO TABS: 2.0000 | ORAL_TABLET | ORAL | Status: DC | PRN

## 2017-12-28 MED ORDER — BUPIVACAINE HCL (PF) 0.25 % IJ SOLN
INTRAMUSCULAR | Status: AC
  Filled 2017-12-28: qty 10

## 2017-12-28 MED ORDER — DIPHENHYDRAMINE HCL 25 MG PO CAPS: 25.0000 mg | ORAL_CAPSULE | Freq: Four times a day (QID) | ORAL | Status: DC | PRN

## 2017-12-28 MED ORDER — SENNOSIDES-DOCUSATE SODIUM 8.6-50 MG PO TABS
2.0000 | ORAL_TABLET | ORAL | Status: DC
  Administered 2017-12-29 (×2): 2 via ORAL
  Filled 2017-12-28 (×2): qty 2

## 2017-12-28 MED ORDER — PHENYLEPHRINE HCL 10 MG/ML IJ SOLN
INTRAMUSCULAR | Status: DC | PRN
  Administered 2017-12-28 (×3): 80 ug via INTRAVENOUS

## 2017-12-28 MED ORDER — SCOPOLAMINE 1 MG/3DAYS TD PT72: 1.0000 | MEDICATED_PATCH | Freq: Once | TRANSDERMAL | Status: DC

## 2017-12-28 MED ORDER — MEPERIDINE HCL 25 MG/ML IJ SOLN
6.2500 mg | INTRAMUSCULAR | Status: DC | PRN
  Administered 2017-12-28: 6.25 mg via INTRAVENOUS

## 2017-12-28 MED ORDER — MORPHINE SULFATE (PF) 0.5 MG/ML IJ SOLN
INTRAMUSCULAR | Status: AC
  Filled 2017-12-28: qty 10

## 2017-12-28 MED ORDER — OXYTOCIN 10 UNIT/ML IJ SOLN
INTRAMUSCULAR | Status: DC | PRN
  Administered 2017-12-28: 40 [IU] via INTRAVENOUS

## 2017-12-28 MED ORDER — OXYTOCIN 10 UNIT/ML IJ SOLN
INTRAMUSCULAR | Status: AC
  Filled 2017-12-28: qty 4

## 2017-12-28 MED ORDER — EPHEDRINE 5 MG/ML INJ: 10.0000 mg | INTRAVENOUS | Status: DC | PRN

## 2017-12-28 MED ORDER — ACETAMINOPHEN 325 MG PO TABS
650.0000 mg | ORAL_TABLET | ORAL | Status: DC | PRN
  Administered 2017-12-28: 650 mg via ORAL
  Filled 2017-12-28: qty 2

## 2017-12-28 MED ORDER — KETOROLAC TROMETHAMINE 30 MG/ML IJ SOLN
INTRAMUSCULAR | Status: AC
  Administered 2017-12-28: 30 mg via INTRAMUSCULAR
  Filled 2017-12-28: qty 1

## 2017-12-28 MED ORDER — LACTATED RINGERS IV SOLN: 500.0000 mL | INTRAVENOUS | Status: DC | PRN

## 2017-12-28 MED ORDER — CEFAZOLIN SODIUM-DEXTROSE 2-4 GM/100ML-% IV SOLN
2.0000 g | INTRAVENOUS | Status: AC
  Administered 2017-12-28: 2 g via INTRAVENOUS

## 2017-12-28 MED ORDER — SCOPOLAMINE 1 MG/3DAYS TD PT72
MEDICATED_PATCH | TRANSDERMAL | Status: AC
  Filled 2017-12-28: qty 1

## 2017-12-28 MED ORDER — NALOXONE HCL 0.4 MG/ML IJ SOLN: 0.4000 mg | INTRAMUSCULAR | Status: DC | PRN

## 2017-12-28 MED ORDER — SOD CITRATE-CITRIC ACID 500-334 MG/5ML PO SOLN
30.0000 mL | ORAL | Status: DC | PRN
  Administered 2017-12-28: 30 mL via ORAL
  Filled 2017-12-28: qty 15

## 2017-12-28 MED ORDER — MORPHINE SULFATE (PF) 0.5 MG/ML IJ SOLN
INTRAMUSCULAR | Status: DC | PRN
  Administered 2017-12-28: 1 mg via INTRAVENOUS
  Administered 2017-12-28: 4 mg via EPIDURAL

## 2017-12-28 MED ORDER — LIDOCAINE HCL (PF) 1 % IJ SOLN
30.0000 mL | INTRAMUSCULAR | Status: DC | PRN
  Filled 2017-12-28: qty 30

## 2017-12-28 MED ORDER — PHENYLEPHRINE 40 MCG/ML (10ML) SYRINGE FOR IV PUSH (FOR BLOOD PRESSURE SUPPORT)
PREFILLED_SYRINGE | INTRAVENOUS | Status: AC
  Filled 2017-12-28: qty 10

## 2017-12-28 MED ORDER — MEPERIDINE HCL 25 MG/ML IJ SOLN
INTRAMUSCULAR | Status: AC
  Filled 2017-12-28: qty 1

## 2017-12-28 MED ORDER — DIPHENHYDRAMINE HCL 25 MG PO CAPS: 25.0000 mg | ORAL_CAPSULE | ORAL | Status: DC | PRN

## 2017-12-28 SURGICAL SUPPLY — 41 items
BENZOIN TINCTURE PRP APPL 2/3 (GAUZE/BANDAGES/DRESSINGS) ×3 IMPLANT
CHLORAPREP W/TINT 26ML (MISCELLANEOUS) ×3 IMPLANT
CLAMP CORD UMBIL (MISCELLANEOUS) IMPLANT
CLOSURE WOUND 1/2 X4 (GAUZE/BANDAGES/DRESSINGS) ×1
CLOTH BEACON ORANGE TIMEOUT ST (SAFETY) ×3 IMPLANT
DRSG OPSITE POSTOP 4X10 (GAUZE/BANDAGES/DRESSINGS) ×3 IMPLANT
ELECT REM PT RETURN 9FT ADLT (ELECTROSURGICAL) ×3
ELECTRODE REM PT RTRN 9FT ADLT (ELECTROSURGICAL) ×1 IMPLANT
EXTRACTOR VACUUM KIWI (MISCELLANEOUS) ×3 IMPLANT
GAUZE SPONGE 4X4 12PLY STRL LF (GAUZE/BANDAGES/DRESSINGS) ×6 IMPLANT
GLOVE BIOGEL PI IND STRL 6.5 (GLOVE) ×1 IMPLANT
GLOVE BIOGEL PI IND STRL 7.0 (GLOVE) ×2 IMPLANT
GLOVE BIOGEL PI INDICATOR 6.5 (GLOVE) ×2
GLOVE BIOGEL PI INDICATOR 7.0 (GLOVE) ×4
GLOVE ECLIPSE 6.5 STRL STRAW (GLOVE) ×3 IMPLANT
GOWN STRL REUS W/TWL LRG LVL3 (GOWN DISPOSABLE) ×6 IMPLANT
KIT ABG SYR 3ML LUER SLIP (SYRINGE) IMPLANT
NDL SAFETY ECLIPSE 18X1.5 (NEEDLE) ×1 IMPLANT
NEEDLE HYPO 18GX1.5 SHARP (NEEDLE) ×2
NEEDLE HYPO 22GX1.5 SAFETY (NEEDLE) ×3 IMPLANT
NEEDLE HYPO 25X5/8 SAFETYGLIDE (NEEDLE) IMPLANT
NS IRRIG 1000ML POUR BTL (IV SOLUTION) ×3 IMPLANT
PACK C SECTION WH (CUSTOM PROCEDURE TRAY) ×3 IMPLANT
PAD ABD 7.5X8 STRL (GAUZE/BANDAGES/DRESSINGS) ×3 IMPLANT
PAD OB MATERNITY 4.3X12.25 (PERSONAL CARE ITEMS) ×3 IMPLANT
PENCIL SMOKE EVAC W/HOLSTER (ELECTROSURGICAL) ×3 IMPLANT
STRIP CLOSURE SKIN 1/2X4 (GAUZE/BANDAGES/DRESSINGS) ×2 IMPLANT
SUT MNCRL 0 VIOLET CTX 36 (SUTURE) ×2 IMPLANT
SUT MONOCRYL 0 CTX 36 (SUTURE) ×4
SUT PDS AB 0 CTX 36 PDP370T (SUTURE) IMPLANT
SUT PLAIN 2 0 XLH (SUTURE) IMPLANT
SUT VIC AB 0 CT1 36 (SUTURE) ×6 IMPLANT
SUT VIC AB 2-0 CT1 27 (SUTURE) ×6
SUT VIC AB 2-0 CT1 TAPERPNT 27 (SUTURE) ×3 IMPLANT
SUT VIC AB 4-0 KS 27 (SUTURE) ×3 IMPLANT
SUT VIC AB 4-0 PS2 27 (SUTURE) ×3 IMPLANT
SYR 30ML LL (SYRINGE) ×3 IMPLANT
SYR CONTROL 10ML LL (SYRINGE) ×3 IMPLANT
TAPE CLOTH SURG 4X10 WHT LF (GAUZE/BANDAGES/DRESSINGS) ×3 IMPLANT
TOWEL OR 17X24 6PK STRL BLUE (TOWEL DISPOSABLE) ×3 IMPLANT
TRAY FOLEY BAG SILVER LF 14FR (SET/KITS/TRAYS/PACK) IMPLANT

## 2017-12-28 NOTE — MAU Note (Signed)
Pt reports uc's since 3am yesterday, but more painful for the last 3 hours.

## 2017-12-28 NOTE — Lactation Note (Signed)
This note was copied from a baby's chart. Lactation Consultation Note  Patient Name: [name] WGNFA'OToday's Date: 12/28/2017 Reason for consult: Initial assessment;1st time breastfeeding;Primapara;Early term 8837-38.6wks  Visited with P1 Mom of 1538w6d baby born by C/S for breech presentation.  Baby 5 hrs old. Baby has latched 3 times since birth.   After nurse finished cleaning Mom, Offered to assist with baby.  Placed baby STS on Mom's chest.  Hand expression demonstrated.  Colostrum easily expressed.  Baby opened his mouth widely and latched onto areola, showing Mom how to sandwich breast in direction of baby's mouth. Baby took a couple sucks and fell asleep.   Reassured Mom that the best place for baby is on her chest, STS.  [facility] Encouraged STS and feeding often on cue.  Goal of >8 feedings per 24 hrs.  Lactation brochure given to Mom.  Mom aware of IP and OP lactation services available.   Maternal Data Formula Feeding for Exclusion: No Has patient been taught Hand Expression?: Yes Does the patient have breastfeeding experience prior to this delivery?: No  Feeding Feeding Type: Breast Fed Length of feed: 1 min  LATCH Score Latch: Too sleepy or reluctant, no latch achieved, no sucking elicited.(a few sucks with a wide latch)  Audible Swallowing: None  Type of Nipple: Everted at rest and after stimulation  Comfort (Breast/Nipple): Soft / non-tender  Hold (Positioning): Assistance needed to correctly position infant at breast and maintain latch.  LATCH Score: 5  Interventions Interventions: Breast feeding basics reviewed;Assisted with latch;Skin to skin;Breast massage;Hand express;Expressed milk;Support pillows;Adjust position;Breast compression;Position options  Lactation Tools Discussed/Used WIC Program: No   Consult Status Consult Status: Follow-up Date: 12/29/17 Follow-up type: In-patient    Judee ClaraSmith, Nemiah Kissner E 12/28/2017, 4:27  PM

## 2017-12-28 NOTE — Progress Notes (Signed)
Abdominal dressing changed per order received.  Steri-strips intact with some bloody drainage, small amount of oozing noted from far right side of incision. New honeycomb dressing applied with new pressure dressing applied as well.  Patient tolerated well, will continue to monitor closely.

## 2017-12-28 NOTE — H&P (Signed)
Amanda [name] is a 27 y.o. female G1P0 at 4438.6 wga presenting for regular and painful contractions for few hours.  Denies any vaginal bleeding or lof.  +FM.  Antepartum course complicated complicated by anemia and pt did receive iv iron x2 - she has been asymptomatic.  Also with h/o migraines and used fioricet prn, stable later in pregnancy.  Pt also with h/o bicuspid aortic valve with mild AS, AR, nml LV function, otherwise nml echo with nml EF.  Cardiology evaluation wnl and no recommendation for delivery/pregnancy.  [facility] 10 wks History OB History    Gravida Para Term Preterm AB Living   1             SAB TAB Ectopic Multiple Live Births                 Past Medical History:  Diagnosis Date  . Bicuspid aortic valve   . Headache    Past Surgical History:  Procedure Laterality Date  . WISDOM TOOTH EXTRACTION     Family History: family history includes Heart disease in her maternal grandmother. Social History:  reports that  has never smoked. she has never used smokeless tobacco. She reports that she drinks alcohol. She reports that she does not use drugs.  ROS  Dilation: 6 Effacement (%): 100 Station: -1 Exam by:: stone rnc Blood pressure (!) 113/96, pulse (!) 105, temperature 98.4 F (36.9 C), temperature source Oral, resp. rate 18, height 5\' 1"  (1.549 m), weight 137 lb (62.1 kg), SpO2 99 %. Exam Physical Exam  Prenatal labs: ABO, Rh: --/--/B NEG, B NEG Performed at [facility], [address] [place], [place] [address]  [phone](02/11 [id number]) Antibody: NEG (02/11 0215) Rubella: Immune (07/26 0000) RPR: Nonreactive (12/05 0000)  HBsAg: Negative (07/26 0000)  HIV: Non-reactive (07/26 0000)  GBS: Negative (01/22 0000)  1 hr Glucola - passed Genetic screening - normal Anatomy US: normal  Physical Exam:  A&O x 3 HEENT : grossly wnl Lungs : ctab CV : rrr Abdo : soft, nt, gravid; efw 7 lbs Extr no edema, nt bilat Pelvic : 7/80-1; arom with  clear fluid; breech, head maternal right  FHT: 130s, normal variability, +accels, occ variable TOCO: q 2-5, becoming more q 5  U/s: confirms breech pesentation  Assessment/Plan: iup at 38.6 wga 1. Admit for labor - malpresentation; Reviewed findings with patient and recommendation for c/s now.  Reviwed r/b/a with patient and risks including bleeding, infection, injury to bowel, bladder, nerves, blood vessels, risk of anesthesia, risk of further surgery; pt agrees to proceed; to OR 2. Fetal status reassuring 3. Mild AR, AS; normal EF 4. gbs neg    Vick FreesSusan E Amil Moseman 12/28/2017, 7:50 AM

## 2017-12-28 NOTE — Transfer of Care (Signed)
Immediate Anesthesia Transfer of Care Note  Patient: Amanda [name]  Procedure(s) Performed: CESAREAN SECTION (N/A )  Patient Location: PACU  Anesthesia Type:Epidural  Level of Consciousness: awake, alert  and oriented  Airway & Oxygen Therapy: Patient Spontanous Breathing  Post-op Assessment: Report given to RN and Post -op Vital signs reviewed and stable  Post vital signs: Reviewed and stable  Last Vitals:  Vitals:   12/28/17 0900 12/28/17 0930  BP: 117/75 (!) 131/95  Pulse: (!) 105 (!) 104  Resp: 18   Temp:    SpO2:      Last Pain:  Vitals:   12/28/17 0800  TempSrc: Oral  PainSc: 0-No pain         Complications: No apparent anesthesia complications

## 2017-12-28 NOTE — Anesthesia Preprocedure Evaluation (Addendum)
Anesthesia Evaluation  Patient identified by MRN, date of birth, ID band Patient awake    Reviewed: Allergy & Precautions, H&P , NPO status , Patient's Chart, lab work & pertinent test results  History of Anesthesia Complications Negative for: history of anesthetic complications  Airway Mallampati: II  TM Distance: >3 FB Neck ROM: full    Dental no notable dental hx. (+) Teeth Intact   Pulmonary neg pulmonary ROS,    Pulmonary exam normal breath sounds clear to auscultation       Cardiovascular Normal cardiovascular exam+ Valvular Problems/Murmurs AS  Rhythm:regular Rate:Normal  Bicuspid aortic valve  ECHO: Pumping function of heart is normal AV again noted to be bicuspic with very mild narrowing  No significant change from echo form 2015 Should not pose a problem with pregnancy   Neuro/Psych  Headaches, negative psych ROS   GI/Hepatic negative GI ROS, Neg liver ROS,   Endo/Other  negative endocrine ROS  Renal/GU negative Renal ROS  negative genitourinary   Musculoskeletal   Abdominal   Peds  Hematology  (+) anemia ,   Anesthesia Other Findings   Reproductive/Obstetrics (+) Pregnancy                            Anesthesia Physical Anesthesia Plan  ASA: III  Anesthesia Plan: Epidural   Post-op Pain Management:    Induction:   PONV Risk Score and Plan:   Airway Management Planned:   Additional Equipment:   Intra-op Plan:   Post-operative Plan:   Informed Consent: I have reviewed the patients History and Physical, chart, labs and discussed the procedure including the risks, benefits and alternatives for the proposed anesthesia with the patient or authorized representative who has indicated his/her understanding and acceptance.     Plan Discussed with:   Anesthesia Plan Comments:        Anesthesia Quick Evaluation

## 2017-12-28 NOTE — Progress Notes (Signed)
Pressure dressing to lower abdomen loosened at bottom edge due to repeated episodes of vomiting. Honeycomb dressing saturated with bloody drainage.  Carlean JewsMeredith Sigmon, CNM, notified of above and order received to change dressing.

## 2017-12-28 NOTE — Anesthesia Postprocedure Evaluation (Signed)
Anesthesia Post Note  Patient: Amanda [name]  Procedure(s) Performed: CESAREAN SECTION (N/A )     Patient location during evaluation: Mother Baby Anesthesia Type: Epidural Level of consciousness: awake and alert and oriented Pain management: pain level controlled Vital Signs Assessment: post-procedure vital signs reviewed and stable Respiratory status: spontaneous breathing and nonlabored ventilation Cardiovascular status: stable Postop Assessment: no headache, no backache, patient able to bend at knees, no signs of nausea or vomiting and adequate PO intake Anesthetic complications: no    Last Vitals:  Vitals:   12/28/17 1445 12/28/17 1545  BP: 128/68 124/77  Pulse: 80 72  Resp: 18 18  Temp: 37.3 C 37.4 C  SpO2: 97% 97%    Last Pain:  Vitals:   12/28/17 1545  TempSrc: Oral  PainSc: 2    Pain Goal: Patients Stated Pain Goal: 3 (12/28/17 1545)               Madison HickmanGREGORY,Chrissy Ealey

## 2017-12-28 NOTE — Anesthesia Procedure Notes (Signed)
Epidural Patient location during procedure: OB Start time: 12/28/2017 3:31 AM  Staffing Anesthesiologist: Leonides GrillsEllender, Ayodele Sangalang P, MD Performed: anesthesiologist   Preanesthetic Checklist Completed: patient identified, site marked, pre-op evaluation, timeout performed, IV checked, risks and benefits discussed and monitors and equipment checked  Epidural Patient position: sitting Prep: DuraPrep Patient monitoring: heart rate, cardiac monitor, continuous pulse ox and blood pressure Approach: midline Location: L4-L5 Injection technique: LOR air  Needle:  Needle type: Tuohy  Needle gauge: 17 G Needle length: 9 cm Needle insertion depth: 5 cm Catheter type: closed end flexible Catheter size: 19 Gauge Catheter at skin depth: 10 cm Test dose: negative and Other  Assessment Events: blood not aspirated, injection not painful, no injection resistance and negative IV test  Additional Notes Informed consent obtained prior to proceeding including risk of failure, 1% risk of PDPH, risk of minor discomfort and bruising. Discussed alternatives to epidural analgesia and patient desires to proceed.  Timeout performed pre-procedure verifying patient name, procedure, and platelet count.  Patient tolerated procedure well. Reason for block:procedure for pain

## 2017-12-28 NOTE — Anesthesia Pain Management Evaluation Note (Signed)
  CRNA Pain Management Visit Note  Patient: Amanda [name], 27 y.o., female  "Hello I am a member of the anesthesia team at [facility]. We have an anesthesia team available at all times to provide care throughout the hospital, including epidural management and anesthesia for C-section. I don't know your plan for the delivery whether it a natural birth, water birth, IV sedation, nitrous supplementation, doula or epidural, but we want to meet your pain goals."   1.Was your pain managed to your expectations on prior hospitalizations?   No prior hospitalizations  2.What is your expectation for pain management during this hospitalization?     Epidural  3.How can we help you reach that goal? epidural  Record the patient's initial score and the patient's pain goal.   Pain: 0  Pain Goal: 4 [facility] wants you to be able to say your pain was always managed very well.  Laban EmperorMalinova,Chrysta Fulcher Hristova 12/28/2017

## 2017-12-28 NOTE — Progress Notes (Signed)
Assisted patient to stand at bedside- patient moved well but did complain of mild dizziness while standing so pads changed at bedside and patient assisted to lie back down.  Felt better once lying down- pt encouraged to drink plenty of fluids and to order snack form cafeteria

## 2017-12-29 ENCOUNTER — Other Ambulatory Visit: Payer: Self-pay

## 2017-12-29 LAB — CBC
HCT: 31.8 % — ABNORMAL LOW (ref 36.0–46.0)
Hemoglobin: 9.8 g/dL — ABNORMAL LOW (ref 12.0–15.0)
MCH: 22.8 pg — AB (ref 26.0–34.0)
MCHC: 30.8 g/dL (ref 30.0–36.0)
MCV: 74.1 fL — AB (ref 78.0–100.0)
Platelets: 172 10*3/uL (ref 150–400)
RBC: 4.29 MIL/uL (ref 3.87–5.11)
RDW: 21.8 % — ABNORMAL HIGH (ref 11.5–15.5)
WBC: 14.3 10*3/uL — ABNORMAL HIGH (ref 4.0–10.5)

## 2017-12-29 LAB — BIRTH TISSUE RECOVERY COLLECTION (PLACENTA DONATION)

## 2017-12-29 MED ORDER — RHO D IMMUNE GLOBULIN 1500 UNIT/2ML IJ SOSY
300.0000 ug | PREFILLED_SYRINGE | Freq: Once | INTRAMUSCULAR | Status: AC
  Administered 2017-12-30: 300 ug via INTRAVENOUS
  Filled 2017-12-29: qty 2

## 2017-12-29 MED ORDER — POLYSACCHARIDE IRON COMPLEX 150 MG PO CAPS
150.0000 mg | ORAL_CAPSULE | Freq: Every day | ORAL | Status: DC
  Administered 2017-12-30: 150 mg via ORAL
  Filled 2017-12-29: qty 1

## 2017-12-29 MED ORDER — MAGNESIUM OXIDE 400 (241.3 MG) MG PO TABS
400.0000 mg | ORAL_TABLET | Freq: Every day | ORAL | Status: DC
  Administered 2017-12-30: 400 mg via ORAL
  Filled 2017-12-29 (×3): qty 1

## 2017-12-29 NOTE — Lactation Note (Signed)
This note was copied from a baby's chart. Lactation Consultation Note  Patient Name: Amanda [name] ZOXWR'UToday's Date: 12/29/2017 Reason for consult: Follow-up assessment;Primapara;1st time breastfeeding;Early term 3237-38.6wks   3833 hours old female who is being exclusively BF by his mother. Mom is complaining about breast pain and soreness in her nipples, upon examination her right nipple shows multiple positional stripes and some erythema, left nipple just erythema, per mom she's still able to latch baby on left breast but baby wasn't ready to feed he was taking a nap when entering the room. Taught mom how to hand expressed and got about 4 cc of colostrum, she'll feed it back to baby once he wakes up. Encourage mom to keep hand expressing (she'll take a break from left breast) and to call her nurse for assistance with latch. She'll also think about if she wants to start a DEBP and will let her nurse know. Mom is aware of LC services and will call PRN.  Interventions Interventions: Breast feeding basics reviewed;Hand express;Breast compression;Expressed milk;Breast massage  Lactation Tools Discussed/Used Tools: Other (comment)(spoon)   Consult Status Consult Status: Follow-up Date: 12/30/17 Follow-up type: In-patient    Pearla Mckinny Venetia ConstableS Ericberto Padget 12/29/2017, 7:40 PM

## 2017-12-29 NOTE — Progress Notes (Addendum)
POSTOPERATIVE DAY # 1 S/P Primary LTCS for Breech presentation in labor, baby boy "Remi DeterSamuel"   S:         Reports feeling better today; has some incisional pain with movement   Reports dizziness, nausea, and itching have all improved since yesterday             Tolerating po intake / no nausea / no vomiting / + flatus / no BM  Denies dizziness, SOB, or CP             Bleeding is moderate, heavy             Pain controlled with Motrin             Up ad lib / ambulatory/ voided x1 since foley has been out and states she has to go again  Newborn breast feeding - reports baby has not been latching as well today  / Circumcision - planning tomorrow by [facility]    O:  VS: BP 116/70 (BP Location: Right Arm)   Pulse 79   Temp 98.9 F (37.2 C) (Oral)   Resp 20   Ht 5\' 1"  (1.549 m)   Wt 62.1 kg (137 lb) Comment: MAU  SpO2 94%   Breastfeeding? Unknown   BMI 25.89 kg/m    LABS:               Recent Labs    12/28/17 0215 12/29/17 0451  WBC 13.2* 14.3*  HGB 10.7* 9.8*  PLT 209 172               Bloodtype: --/--/B NEG, B NEG Performed at [facility], [address] [place], [place] [address]  [phone](02/11 0215)  Rubella: Immune (07/26 0000)                                             I&O: Intake/Output      02/11 0701 - 02/12 0700 02/12 0701 - 02/13 0700   P.O. 570    I.V. (mL/kg) 3462.5 (55.8)    Total Intake(mL/kg) 4032.5 (64.9)    Urine (mL/kg/hr) 2050 (1.4)    Emesis/NG output 200    Blood 561    Total Output 2811    Net +1221.5                      Physical Exam:             Alert and Oriented X3  Lungs: Clear and unlabored  Heart: regular rate and rhythm / no murmurs  Abdomen: soft, non-tender, non-distended; hypoactive bowel sounds              Fundus: firm, non-tender, U+1 (bladder full)             Dressing: pressure dressing on but able to visualize honeycomb with steri strips; small amount of sanguinous drainage noted on left side of dressing; otherwise c/d/i            Incision:  approximated with sutures / no erythema / no ecchymosis / no new drainage  Perineum: intact  Lochia: moderate, no clots   Extremities: no edema, no calf pain or tenderness  A:        POD # 1 S/P Primary LTCS for Breech presentation in labor            ABL  Anemia compounding Maternal IDA - stable, asymptomatic   RH negative - Baby blood type B Pos  Hx. Of bicuspid aortic valve with mild AS, AR - plan to f/u PP with cardiology  P:        Routine postoperative care              Niferex 150mg  daily  Magnesium oxide 400mg  daily   Rhogam today  May shower today and remove pressure dressing  See lactation again; encouraged to call out for assistance for latching   Plan for circ tomorrow  Continue current care  Carlean Jews, MSN, CNM [facility]

## 2017-12-30 MED ORDER — MAGNESIUM OXIDE 400 (241.3 MG) MG PO TABS: 400.0000 mg | ORAL_TABLET | Freq: Every day | ORAL | 0 refills | Status: AC

## 2017-12-30 MED ORDER — IBUPROFEN 600 MG PO TABS: 600.0000 mg | ORAL_TABLET | Freq: Four times a day (QID) | ORAL | 0 refills | Status: AC

## 2017-12-30 MED ORDER — OXYCODONE HCL 5 MG PO TABS: 5.0000 mg | ORAL_TABLET | Freq: Four times a day (QID) | ORAL | 0 refills | Status: AC | PRN

## 2017-12-30 MED ORDER — OXYCODONE-ACETAMINOPHEN 5-325 MG PO TABS: 1.0000 | ORAL_TABLET | ORAL | 0 refills | Status: DC | PRN

## 2017-12-30 MED ORDER — POLYSACCHARIDE IRON COMPLEX 150 MG PO CAPS: 150.0000 mg | ORAL_CAPSULE | Freq: Every day | ORAL | 0 refills | Status: AC

## 2017-12-30 NOTE — Lactation Note (Signed)
This note was copied from a baby's chart. Lactation Consultation Note  Patient Name: Amanda [name]'s Date: 12/30/2017 Reason for consult: Follow-up assessment;Primapara;1st time breastfeeding;Nipple pain/trauma;Early term 37-38.6wks;Infant weight loss(7% weight loss , baby going for circ at 1545 , baby ate at 1500 )  Baby is 4553 hours old.  Per mom last fed at 1500 for 15 mins, with #20 NS and milk in the NS after feeding.  LC reviewed sore nipple and engorgement prevention and tx.  Mom will have a hand pump and a DEBP at home.  LC resized for NS and noted the #20 NS will be ok for today, but mentioned to mom as the  Baby is opening his mouth better, the #24 NS will fit better with EBM or formula in the top of  NS and then latch.  LC recommended to enhance healing of the nipples - breast feed 15 -20 mins 1st breast,  Supplement with EBM and formula if not available, and post pump both breast for 10 - 15 mins ,  And next feeding offer the other breast and do the same.  Mom and dad receptive to [facility] Next Monday or Tuesday.  Mother informed of post-discharge support and given phone number to the lactation department, including services for phone call assistance; out-patient appointments; and breastfeeding support group. List of other breastfeeding resources in the community given in the handout. Encouraged mother to call for problems or concerns related to breastfeeding.  LC [facility]    Maternal Data Has patient been taught Hand Expression?: Yes  Feeding Feeding Type: Breast Fed Nipple Type: Slow - flow Length of feed: 15 min(per mom swallows, and milk in the NS )  LATCH Score                   Interventions Interventions: Breast feeding basics reviewed;Shells;Coconut oil;Comfort gels;Hand pump;DEBP  Lactation Tools Discussed/Used Tools: Pump;Shells;Nipple Shields;Comfort gels Nipple shield  size: 20;24;Other (comment)(LC resized the NS - see LC note ) Shell Type: Inverted Breast pump type: Double-Electric Breast Pump;Manual WIC Program: No Pump Review: Milk Storage(LC reviewed )   Consult Status Consult Status: Follow-up Date: (mom receptove to coming back for Lc O/P appt. ) Follow-up type: Out-patient(LC placed [facility] the [facility] basket )    Matilde SprangMargaret Ann [facility] 12/30/2017, 4:00 PM

## 2017-12-30 NOTE — Discharge Summary (Signed)
OB Discharge Summary  Patient Name: Amanda [name] DOB: 22-Jan-1991 MRN: [id number]  Date of admission: 12/28/2017  Admitting diagnosis: 38 WEEKS CTX Intrauterine pregnancy: 7267w6d     Secondary diagnosis: Breech presentation   Date of discharge: 12/30/2017    Discharge diagnosis: Term Pregnancy Delivered by primary cesarean section   Prenatal history: G1P1001   EDC : 01/05/2018, by Other Basis  [facility]  Primary provider : [facility] Prenatal course uncomplicated  Prenatal Labs: ABO, Rh: --/--/B NEG (02/12 0451) / Rhophylac given postpartum (newborn RH pos) Antibody: NEG (02/11 0215) Rubella: Immune (07/26 0000)   RPR: Non Reactive (02/11 0215)  HBsAg: Negative (07/26 0000)  HIV: Non-reactive (07/26 0000)  GBS: Negative (01/22 0000)                                    Hospital course:  Onset of Labor With Unplanned C/S  27 y.o. yo G1P1001 at 1667w6d was admitted in Active Labor on 12/28/2017. Patient had a labor course significant for malpresentation - breech in labor. Membrane Rupture Time/Date: 9:37 AM ,12/28/2017   The patient went for cesarean section due to Malpresentation, and delivered a Viable infant,12/28/2017  Details of operation can be found in separate operative note.  Patient had an uncomplicated postpartum course.  She is ambulating,tolerating a regular diet, passing flatus, and urinating well.  Patient is discharged home in stable condition 12/30/17 on postop day 2.  Delivering PROVIDER: Rhoderick MoodyALMQUIST, SUSAN E                                                            Complications: None  Newborn Data: Live born female  Birth Weight: 7 lb 2.5 oz (3245 g) APGAR: 9, 9  Newborn Delivery   Birth date/time:  12/28/2017 10:29:00 Delivery type:  C-Section, Low Transverse C-section categorization:  Primary     Baby Feeding: Bottle and Breast Disposition:home with mother  Post partum procedures:none  Labs: Lab Results   Component Value Date   WBC 14.3 (H) 12/29/2017   HGB 9.8 (L) 12/29/2017   HCT 31.8 (L) 12/29/2017   MCV 74.1 (L) 12/29/2017   PLT 172 12/29/2017   CMP Latest Ref Rng & Units 05/01/2016  Glucose 65 - 99 mg/dL 95  BUN 6 - 20 mg/dL 11  Creatinine 4.090.44 - 8.111.00 mg/dL 9.140.64  Sodium 782135 - 956145 mmol/L 136  Potassium 3.5 - 5.1 mmol/L 4.1  Chloride 101 - 111 mmol/L 105  CO2 22 - 32 mmol/L 24  Calcium 8.9 - 10.3 mg/dL 9.6  Total Protein 6.5 - 8.1 g/dL 7.1  Total Bilirubin 0.3 - 1.2 mg/dL 2.1(H0.2(L)  Alkaline Phos 38 - 126 U/L 53  AST 15 - 41 U/L 44(H)  ALT 14 - 54 U/L 65(H)      Physical Exam @ time of discharge:  Vitals:   12/29/17 0159 12/29/17 0340 12/29/17 0557 12/30/17 0522  BP:  116/70  122/83  Pulse:  79  85  Resp:  20  20  Temp:  98.9 F (37.2 C)  98.1 F (36.7 C)  TempSrc:  Oral  Oral  SpO2: 97% 95% 94%   Weight:  Height:        General: alert, cooperative and no distress Lochia: appropriate Uterine Fundus: firm Perineum: intact Incision: Healing well with no significant drainage Extremities: DVT Evaluation: No evidence of DVT seen on physical exam.   Discharge instructions:  "Baby and Me Booklet" and [facility] Book[facility]  Discharge Medications:  Allergies as of 12/30/2017      Reactions   Rizatriptan Anaphylaxis   Zofran [ondansetron Hcl] Anaphylaxis      Medication List    TAKE these medications   ibuprofen 600 MG tablet Commonly known as:  ADVIL,MOTRIN Take 1 tablet (600 mg total) by mouth every 6 (six) hours.   iron polysaccharides 150 MG capsule Commonly known as:  NIFEREX Take 1 capsule (150 mg total) by mouth daily. Start taking on:  12/31/2017   magnesium oxide 400 (241.3 Mg) MG tablet Commonly known as:  MAG-OX Take 1 tablet (400 mg total) by mouth daily. Start taking on:  12/31/2017   oxyCODONE-acetaminophen 5-325 MG tablet Commonly known as:  PERCOCET/ROXICET Take 1 tablet by mouth every 4 (four) hours as needed (pain scale 4-7).    PRENATAL VITAMIN PO Take 1 tablet by mouth daily.            Discharge Care Instructions  (From admission, onward)        Start     Ordered   12/30/17 0000  Discharge wound care:    Comments:  Keep incision clean and dry   12/30/17 1526      Diet: routine diet  Activity: Advance as tolerated. Pelvic rest x 6 weeks.   Follow up:2 weeks    Signed: Marlinda Mike CNM, MSN, [facility] 12/30/2017, 3:27 PM

## 2017-12-30 NOTE — Progress Notes (Signed)
S:  Reports feeling a little better today.  C/o pain in incision with movement.  Pain managed with percocet and ibuprofen.  Tolerating regular diet without N/V.  Up ad lib/ambulating.  Voiding without difficulty.  Passing flatus.  Reports bleeding as light, without clots.  Is having some difficulty with latching baby and subsequent nipple pain bilaterally, worse on right.  Feels that family support is adequate.    O: Vital signs BP 122/83 (BP Location: Right Arm)   Pulse 85   Temp 98.1 F (36.7 C) (Oral)   Resp 20   Ht 5\' 1"  (1.549 m)   Wt 62.1 kg (137 lb) Comment: MAU  SpO2 94%   Breastfeeding? Unknown   BMI 25.89 kg/m  Labs   Recent Labs    12/28/17 0215 12/29/17 0451  WBC 13.2* 14.3*  HGB 10.7* 9.8*  PLT 209 172    General:  A&O x 3, cooperative, NAD. Skin:  Warm, dry, appropriate for ethnicity HEENT:  Symmetrical, no lumps noted.  Sclera white. No lesions, discharge, or swelling noted. Heart:  RRR, no murmurs. Lungs:  Breathing even and unlabored,  CTA bilaterally. Breasts:  Symmetric, bilateral nipple redness, excoriated on right areola at 11 o'clock position. Abdomen:  Soft, non-tender, non-distended.  Bowel sounds present all quadrants.  Some silver striae noted. Musculoskeletal:  Full ROM of neck, arm, and legs. Peripheral:  Radial pulses equal bilaterally.  No varicosities noted.  No edema. C/S incision:  Honeycomb dressing in place.  Clean, dry, and intact.  Fundus: firm, non-tender, U-2 Lochia scant, without clots. Perineum:  Intact, no edema. Marland Kitchen.  A: PP Post-op day 2 Iron deficiency anemia with compounding ABL anemia. Bonding well with infant. Nipple excoriation, right breast Frustrated with breastfeeding  P:   Anticipate discharge this evening or tomorrow morning. Continue iron supplementation. Return to clinic for PP visit at 2 weeks for incision and breastfeeding check. Continue breast expression while nipples are sore and feed expressed milk. Look  into breastfeeding support circles. Teaching/education given on breast care for bottle/breastfeeding, perineal care, diet, exercise, resuming intercourse, and incision care.  Discussed  S/s of infection and when to call office.  Ceasar MonsSarah Charlita Brian, RN, SNM 12/30/2017 , 10:21 AM

## 2017-12-30 NOTE — Lactation Note (Signed)
This note was copied from a baby's chart. Lactation Consultation Note Baby 44 hrs. Old. Mom in tears w/latching. Nipples red, has redness to [place] glands. Fitted w/#20 NS. Mom grimace in pain, body shaking, tearful. Unlatched baby. Nipple fit well when applied in #20 NS, after baby suckling, nipple shrank. #16 NS fitted, mom stated much much better, tolerable. Baby BF well. Taught "C" hold for latching using NS. Taught how to flange upper and bottom lip.  Baby chomps and bobs his head up and down instead of suckling and extending his lower jaw.  Mom spoon fed colostrum hand expressed. LC inserted formula into NS to start baby latching. Baby BF well, formula inserted at intervals.  Supplementing reviewed w/mom, gave information sheet regarding amount per hour of age, LEAD discussed per RN. LC reinforced importance of giving colostrum verse formula first then formula if needed.  Mom using DEBP, FOB cleaning properly.  Mom has comfort gels and coconut oil. Mom stated she knows not to use together. Reported to RN.  Patient Name: Amanda [name] OZHYQ'MToday's Date: 12/30/2017 Reason for consult: Follow-up assessment;Nipple pain/trauma;Mother's request;Difficult latch   Maternal Data    Feeding Feeding Type: Formula Nipple Type: Slow - flow Length of feed: 20 min  LATCH Score Latch: Grasps breast easily, tongue down, lips flanged, rhythmical sucking.  Audible Swallowing: A few with stimulation  Type of Nipple: Everted at rest and after stimulation  Comfort (Breast/Nipple): Engorged, cracked, bleeding, large blisters, severe discomfort  Hold (Positioning): Full assist, staff holds infant at breast  LATCH Score: 5  Interventions Interventions: Breast feeding basics reviewed;Coconut oil;Assisted with latch;Breast compression;Skin to skin;Adjust position;Comfort gels;Support pillows;Hand pump;Breast massage;Hand express;Position options;DEBP  Lactation Tools Discussed/Used Tools:  Pump;Coconut oil;Comfort gels;Nipple Dorris CarnesShields;Bottle;Other (comment)(curve tip syring) Nipple shield size: 16 Breast pump type: Double-Electric Breast Pump   Consult Status Consult Status: Follow-up Date: 12/31/17 Follow-up type: In-patient    Amanda [name], Amanda [name] 12/30/2017, 6:32 AM

## 2017-12-30 NOTE — Lactation Note (Signed)
This note was copied from a baby's chart. Lactation Consultation Note Baby is very jittery. Mom stated the baby has been that way since birth. RN is aware. Mom stated they have checked the sugar was WDL. RN aware. Patient Name: Amanda [name] UEAVW'UToday's Date: 12/30/2017 Reason for consult: Follow-up assessment;Nipple pain/trauma;Mother's request;Difficult latch   Maternal Data    Feeding Feeding Type: Formula Nipple Type: Slow - flow Length of feed: 20 min  LATCH Score Latch: Grasps breast easily, tongue down, lips flanged, rhythmical sucking.  Audible Swallowing: A few with stimulation  Type of Nipple: Everted at rest and after stimulation  Comfort (Breast/Nipple): Engorged, cracked, bleeding, large blisters, severe discomfort  Hold (Positioning): Full assist, staff holds infant at breast  LATCH Score: 5  Interventions Interventions: Breast feeding basics reviewed;Coconut oil;Assisted with latch;Breast compression;Skin to skin;Adjust position;Comfort gels;Support pillows;Hand pump;Breast massage;Hand express;Position options;DEBP  Lactation Tools Discussed/Used Tools: Pump;Coconut oil;Comfort gels;Nipple Dorris CarnesShields;Bottle;Other (comment)(curve tip syring) Nipple shield size: 16 Breast pump type: Double-Electric Breast Pump   Consult Status Consult Status: Follow-up Date: 12/31/17 Follow-up type: In-patient    Naara Kelty, Diamond NickelLAURA G 12/30/2017, 7:01 AM

## 2017-12-31 LAB — RH IG WORKUP (INCLUDES ABO/RH)
ABO/RH(D): B NEG
FETAL SCREEN: NEGATIVE
Gestational Age(Wks): 38.6
UNIT DIVISION: 0

## 2018-01-13 ENCOUNTER — Encounter (HOSPITAL_COMMUNITY): Payer: Self-pay | Admitting: *Deleted

## 2018-03-03 IMAGING — US US TRANSVAGINAL NON-OB
1 series · 13 of 25 positions shown · non-contrast
Comparison: None

CLINICAL DATA: Pelvic pain tonight.

EXAM:
TRANSABDOMINAL AND TRANSVAGINAL ULTRASOUND OF PELVIS
TECHNIQUE: Both transabdominal and transvaginal ultrasound examinations of the
pelvis were performed. Transabdominal technique was performed for
global imaging of the pelvis including uterus, ovaries, adnexal
regions, and pelvic cul-de-sac. It was necessary to proceed with
endovaginal exam following the transabdominal exam to visualize the
endometrium and ovaries.

[Series 1: us transvaginal non-ob · 0.21mm/px · 13 of 59 slices shown]
[im 1/59]
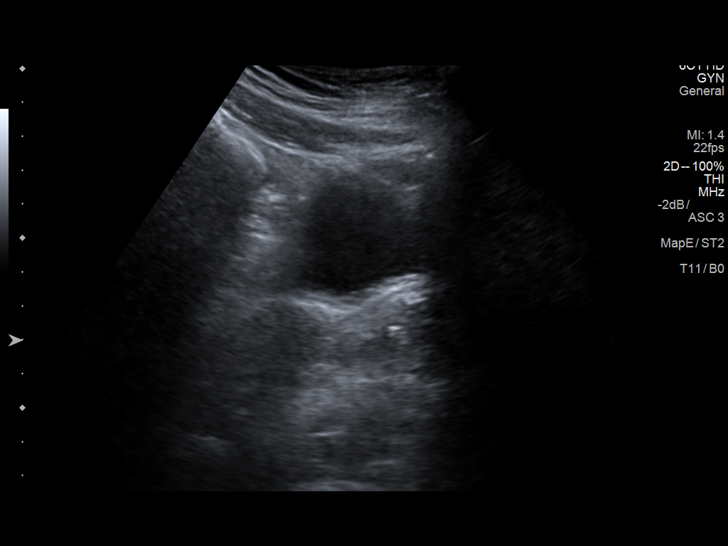
[im 5/59]
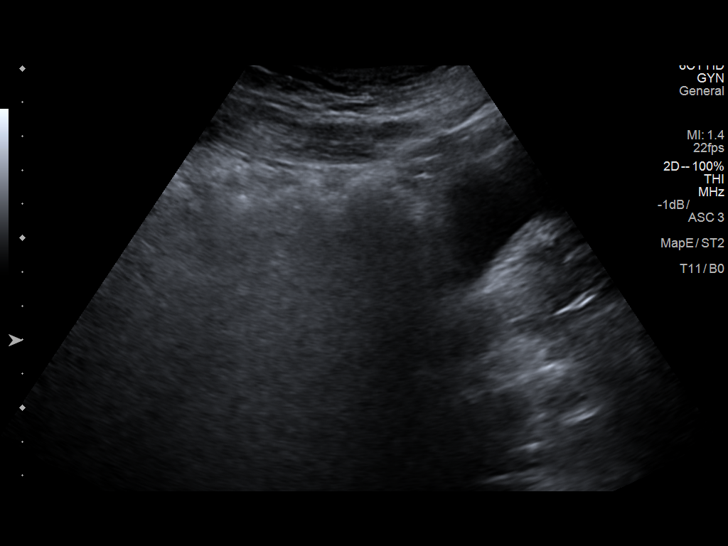
[im 10/59]
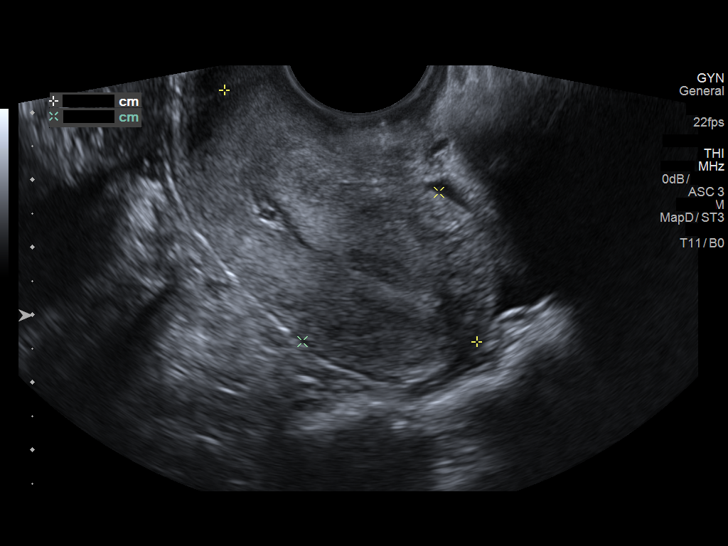
[im 15/59]
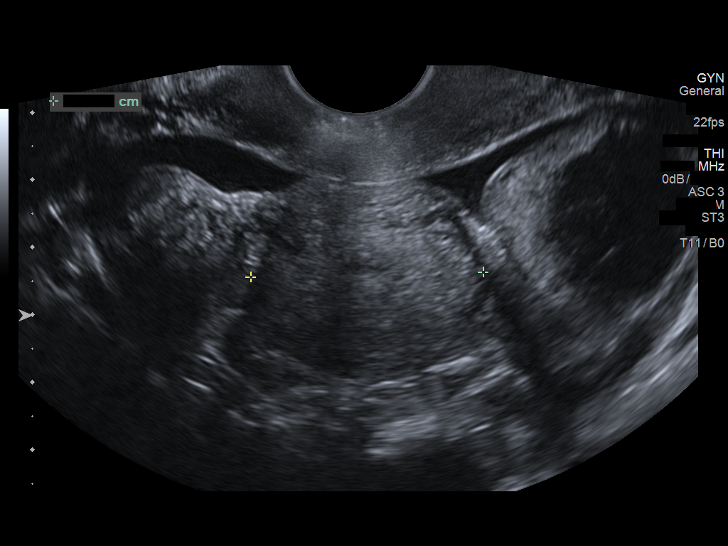
[im 20/59]
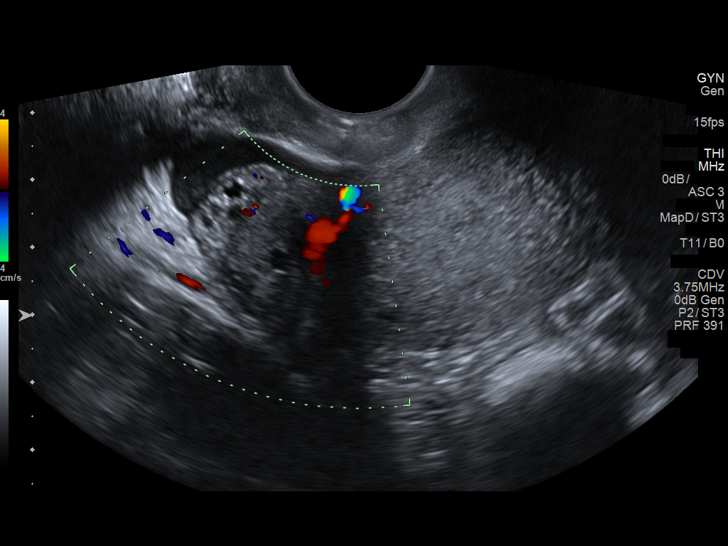
[im 25/59]
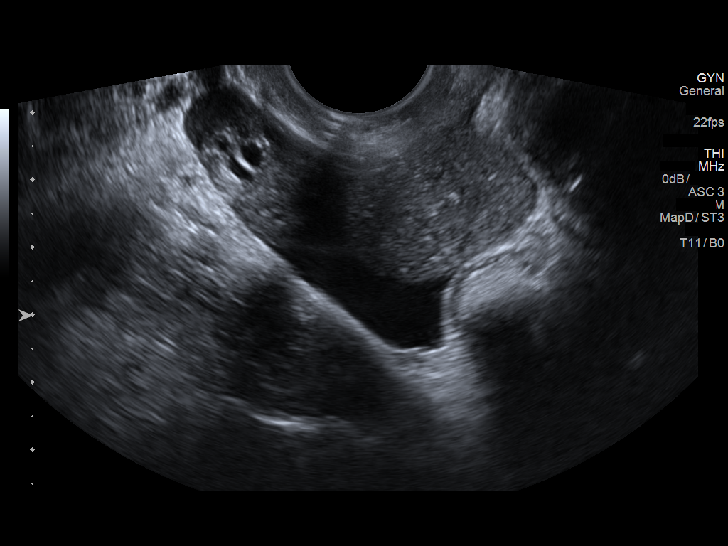
[im 30/59]
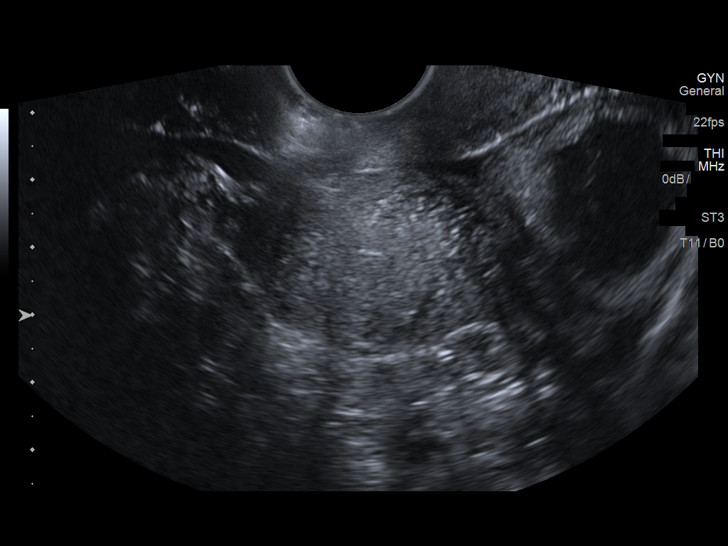
[im 34/59]
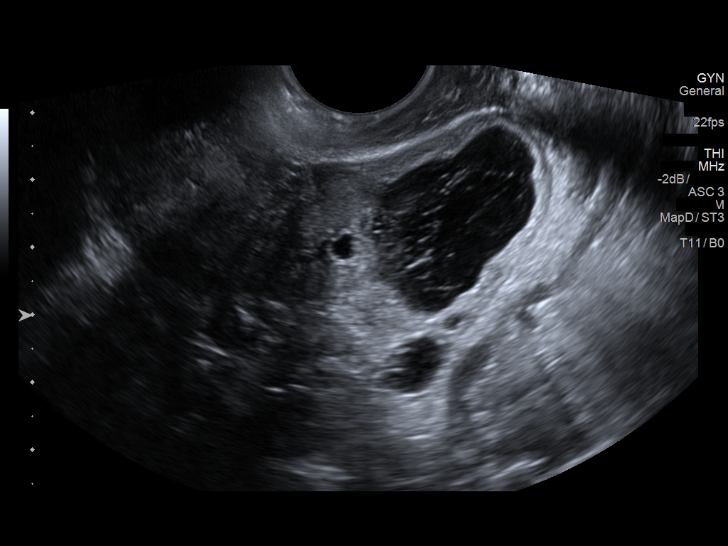
[im 39/59]
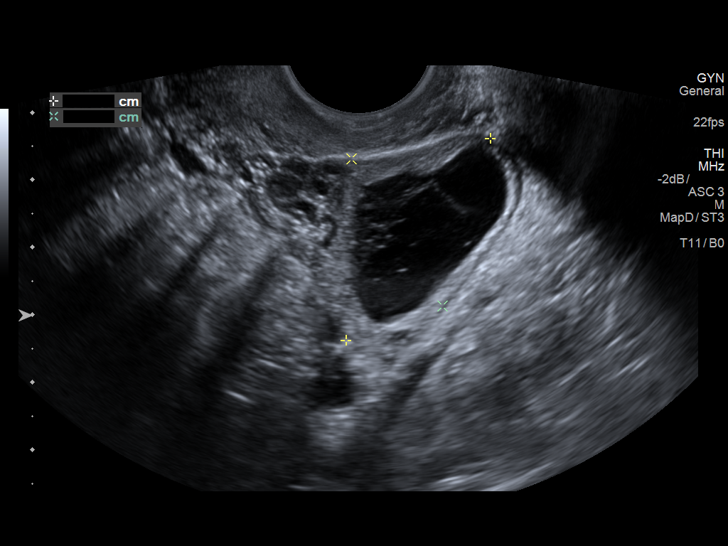
[im 44/59]
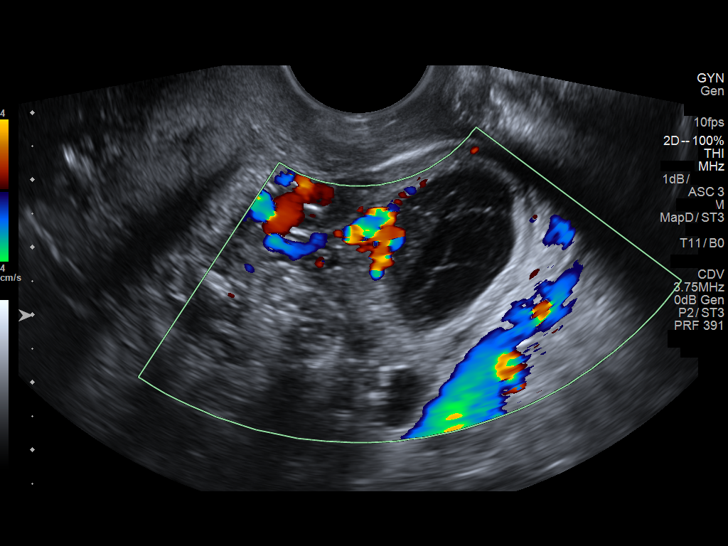
[im 49/59]
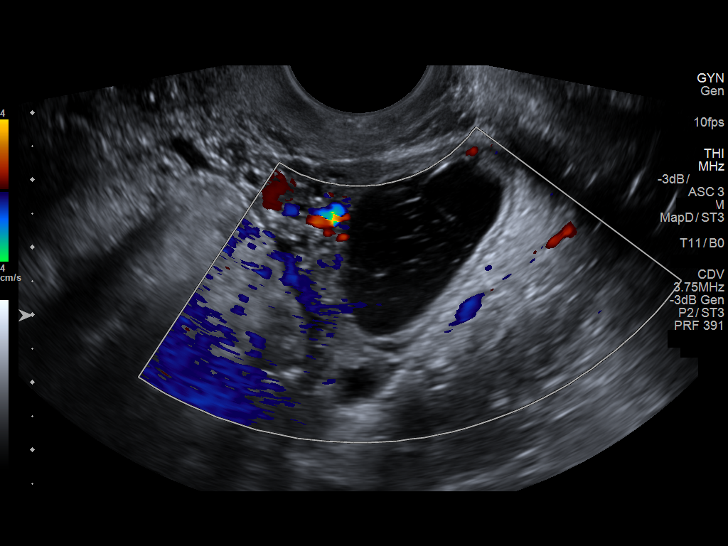
[im 54/59]
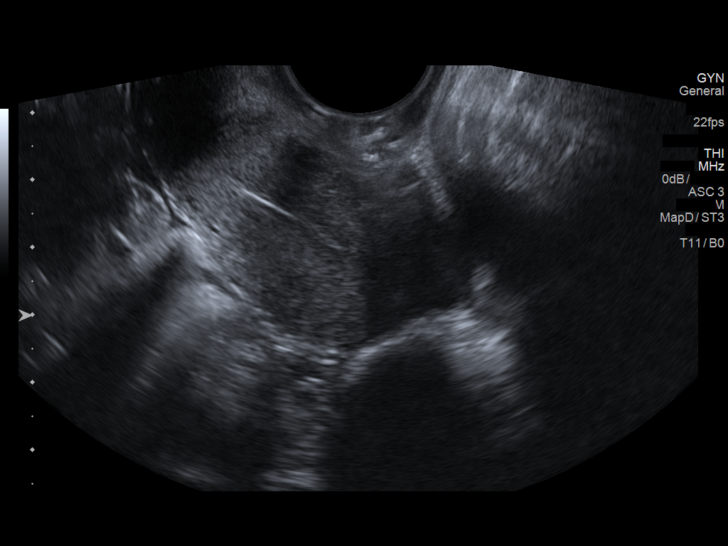
[im 59/59]
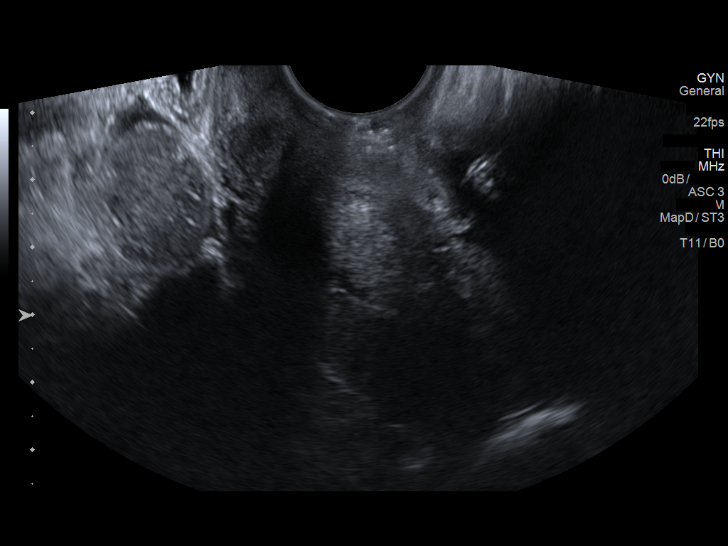

[13 of 25 positions shown; findings below may reference images not displayed]

FINDINGS: Uterus

Measurements: 5.3 x 3 x 3.5 cm. Uterus is retroverted. No fibroids
or other mass visualized.

Endometrium

Thickness: 4.8 mm.  No focal abnormality visualized.

Right ovary

Measurements: 2.6 x 1.7 x 1.8 cm. Normal appearance/no adnexal mass.

Left ovary

Measurements: 3.7 x 2.6 x 3.8 cm. Complex cystic structure in the
left ovary with Bayer internal pattern. No flow demonstrated within
the lesion on color flow imaging. Lesion measures 3.3 cm maximal
diameter. This likely represents a hemorrhagic cyst.

Other findings

Small a moderate free fluid in the pelvis mostly on the right
adnexal region.
IMPRESSION: Normal ultrasound appearance of the uterus and right ovary. Probable
hemorrhagic cyst on the left ovary. Based on size and appearance in
a premenopausal patient, no follow-up is indicated. Small moderate
free fluid in the pelvis.

## 2019-09-13 ENCOUNTER — Other Ambulatory Visit: Payer: Self-pay | Admitting: Registered"

## 2019-09-13 DIAGNOSIS — Z20822 Contact with and (suspected) exposure to covid-19: Secondary | ICD-10-CM

## 2019-09-16 LAB — NOVEL CORONAVIRUS, NAA: SARS-CoV-2, NAA: NOT DETECTED
